# Patient Record
Sex: Female | Born: 1955 | Race: Asian | Hispanic: No | Smoking: Never smoker
Health system: Southern US, Community
[De-identification: ages and names within clinical notes are randomized; demographics above are authoritative.]

## PROBLEM LIST (undated history)

## (undated) DIAGNOSIS — E119 Type 2 diabetes mellitus without complications: Secondary | ICD-10-CM

## (undated) HISTORY — PX: ABDOMINAL HYSTERECTOMY: SHX81

## (undated) NOTE — *Deleted (*Deleted)
Took rep

---

## 2015-08-02 ENCOUNTER — Emergency Department
Admission: EM | Admit: 2015-08-02 | Discharge: 2015-08-02 | Disposition: A | Discharge status: Home / Self Care | Payer: BLUE CROSS/BLUE SHIELD | Attending: Emergency Medicine | Admitting: Emergency Medicine

## 2015-08-02 ENCOUNTER — Encounter: Payer: Self-pay | Admitting: Emergency Medicine

## 2015-08-02 DIAGNOSIS — E1165 Type 2 diabetes mellitus with hyperglycemia: Secondary | ICD-10-CM | POA: Diagnosis not present

## 2015-08-02 DIAGNOSIS — R300 Dysuria: Secondary | ICD-10-CM | POA: Diagnosis present

## 2015-08-02 DIAGNOSIS — R739 Hyperglycemia, unspecified: Secondary | ICD-10-CM

## 2015-08-02 DIAGNOSIS — N390 Urinary tract infection, site not specified: Secondary | ICD-10-CM | POA: Diagnosis not present

## 2015-08-02 HISTORY — DX: Type 2 diabetes mellitus without complications: E11.9

## 2015-08-02 LAB — URINALYSIS COMPLETE WITH MICROSCOPIC (ARMC ONLY)
BACTERIA UA: NONE SEEN
Bilirubin Urine: NEGATIVE
Glucose, UA: >500 mg/dL — AB
Ketones, ur: NEGATIVE mg/dL
Nitrite: NEGATIVE
PROTEIN: 100 mg/dL — AB
SQUAMOUS EPITHELIAL / LPF: NONE SEEN
Specific Gravity, Urine: 1.017 (ref 1.005–1.030)
pH: 6 (ref 5.0–8.0)

## 2015-08-02 LAB — GLUCOSE, CAPILLARY: GLUCOSE-CAPILLARY: 323 mg/dL — AB (ref 65–99)

## 2015-08-02 MED ORDER — SULFAMETHOXAZOLE-TRIMETHOPRIM 800-160 MG PO TABS: 1.0000 | ORAL_TABLET | Freq: Two times a day (BID) | ORAL | Status: DC

## 2015-08-02 MED ORDER — PHENAZOPYRIDINE HCL 200 MG PO TABS: 200.0000 mg | ORAL_TABLET | Freq: Three times a day (TID) | ORAL | Status: DC | PRN

## 2015-08-02 NOTE — ED Notes (Signed)
Pt and family taught the importance of follow up for hyperglycemia. Pts family verbalized understanding.

## 2015-08-02 NOTE — ED Provider Notes (Signed)
[facility] Emergency Department Provider Note  ____________________________________________  Time seen: Approximately 4:44 PM  I have reviewed the triage vital signs and the nursing notes.   HISTORY  Chief Complaint Dysuria    HPI Alexis Daugherty is a 60 y.o. female presents for evaluation of painful urination times one week. Patient reports having blood in her urine with frequency. No relief with over-the-counter medications. Positive for low-grade fever. Past medical history significant for diabetes without complications.   Past Medical History  Diagnosis Date  . Diabetes mellitus without complication (HCC)     There are no active problems to display for this patient.   Past Surgical History  Procedure Laterality Date  . Abdominal hysterectomy      Current Outpatient Rx  Name  Route  Sig  Dispense  Refill  . phenazopyridine (PYRIDIUM) 200 MG tablet   Oral   Take 1 tablet (200 mg total) by mouth 3 (three) times daily as needed for pain.   6 tablet   0   . sulfamethoxazole-trimethoprim (BACTRIM DS,SEPTRA DS) 800-160 MG tablet   Oral   Take 1 tablet by mouth 2 (two) times daily.   20 tablet   0     Allergies Review of patient's allergies indicates no known allergies.  No family history on file.  Social History Social History  Substance Use Topics  . Smoking status: Never Smoker   . Smokeless tobacco: None  . Alcohol Use: No    Review of Systems Constitutional: No fever/chills Eyes: No visual changes. ENT: No sore throat. Cardiovascular: Denies chest pain. Respiratory: Denies shortness of breath. Gastrointestinal: No abdominal pain.  No nausea, no vomiting.  No diarrhea.  No constipation. Genitourinary: Positive for dysuria and frequency. Musculoskeletal: Negative for back pain. Skin: Negative for rash. Neurological: Negative for headaches, focal weakness or numbness.  10-point ROS otherwise  negative.  ____________________________________________   PHYSICAL EXAM:  VITAL SIGNS: ED Triage Vitals  Enc Vitals Group     BP 08/02/15 1628 137/73 mmHg     Pulse Rate 08/02/15 1628 93     Resp 08/02/15 1628 18     Temp 08/02/15 1628 99.8 F (37.7 C)     Temp Source 08/02/15 1628 Oral     SpO2 08/02/15 1628 98 %     Weight 08/02/15 1628 130 lb (58.968 kg)     Height 08/02/15 1628 5\' 1"  (1.549 m)     Head Cir --      Peak Flow --      Pain Score 08/02/15 1629 9     Pain Loc --      Pain Edu? --      Excl. in GC? --     Constitutional: Alert and oriented. Well appearing and in no acute distress. Cardiovascular: Normal rate, regular rhythm. Grossly normal heart sounds.  Good peripheral circulation. Respiratory: Normal respiratory effort.  No retractions. Lungs CTAB. Gastrointestinal: Soft and nontender. No distention. No abdominal bruits. Mild CVAT Musculoskeletal: No lower extremity tenderness nor edema.  No joint effusions. Neurologic:  Normal speech and language. No gross focal neurologic deficits are appreciated. No gait instability. Skin:  Skin is warm, dry and intact. No rash noted. Psychiatric: Mood and affect are normal. Speech and behavior are normal.  ____________________________________________   LABS (all labs ordered are listed, but only abnormal results are displayed)  Labs Reviewed  URINALYSIS COMPLETEWITH MICROSCOPIC ([facility] ONLY) - Abnormal; Notable for the following:    Color, Urine STRAW (*)  APPearance CLOUDY (*)    Glucose, UA >500 (*)    Hgb urine dipstick 2+ (*)    Protein, ur 100 (*)    Leukocytes, UA 3+ (*)    All other components within normal limits  GLUCOSE, CAPILLARY - Abnormal; Notable for the following:    Glucose-Capillary 323 (*)    All other components within normal limits  CBG MONITORING, ED    ____________________________________________  EKG   ____________________________________________  RADIOLOGY   ____________________________________________   PROCEDURES  Procedure(s) performed: None  Critical Care performed: No  ____________________________________________   INITIAL IMPRESSION / ASSESSMENT AND PLAN / ED COURSE  Pertinent labs & imaging results that were available during my care of the patient were reviewed by me and considered in my medical decision making (see chart for details).  Acute urinary tract infection with secondary hyperglycemia. Rx given for Bactrim DS twice a day, peridium 3 times a day. Patient encouraged to follow up with PCP for elevated blood glucose which was 323 while in the ED. Patient voices no other emergency medical complaints at this time. ____________________________________________   FINAL CLINICAL IMPRESSION(S) / ED DIAGNOSES  Final diagnoses:  UTI (lower urinary tract infection)  Hyperglycemia     This chart was dictated using voice recognition software/Dragon. Despite best efforts to proofread, errors can occur which can change the meaning. Any change was purely unintentional.   Evangeline Dakin, PA-C 08/02/15 1753  Rockne Menghini, MD 08/02/15 1850

## 2015-08-02 NOTE — ED Notes (Signed)
Pain with urination x 1 week

## 2015-08-02 NOTE — Discharge Instructions (Signed)
Urinary Tract Infection Urinary tract infections (UTIs) can develop anywhere along your urinary tract. Your urinary tract is your body's drainage system for removing wastes and extra water. Your urinary tract includes two kidneys, two ureters, a bladder, and a urethra. Your kidneys are a pair of bean-shaped organs. Each kidney is about the size of your fist. They are located below your ribs, one on each side of your spine. CAUSES Infections are caused by microbes, which are microscopic organisms, including fungi, viruses, and bacteria. These organisms are so small that they can only be seen through a microscope. Bacteria are the microbes that most commonly cause UTIs. SYMPTOMS  Symptoms of UTIs may vary by age and gender of the patient and by the location of the infection. Symptoms in young women typically include a frequent and intense urge to urinate and a painful, burning feeling in the bladder or urethra during urination. Older women and men are more likely to be tired, shaky, and weak and have muscle aches and abdominal pain. A fever may mean the infection is in your kidneys. Other symptoms of a kidney infection include pain in your back or sides below the ribs, nausea, and vomiting. DIAGNOSIS To diagnose a UTI, your caregiver will ask you about your symptoms. Your caregiver will also ask you to provide a urine sample. The urine sample will be tested for bacteria and white blood cells. White blood cells are made by your body to help fight infection. TREATMENT  Typically, UTIs can be treated with medication. Because most UTIs are caused by a bacterial infection, they usually can be treated with the use of antibiotics. The choice of antibiotic and length of treatment depend on your symptoms and the type of bacteria causing your infection. HOME CARE INSTRUCTIONS  If you were prescribed antibiotics, take them exactly as your caregiver instructs you. Finish the medication even if you feel better after  you have only taken some of the medication.  Drink enough water and fluids to keep your urine clear or pale yellow.  Avoid caffeine, tea, and carbonated beverages. They tend to irritate your bladder.  Empty your bladder often. Avoid holding urine for long periods of time.  Empty your bladder before and after sexual intercourse.  After a bowel movement, women should cleanse from front to back. Use each tissue only once. SEEK MEDICAL CARE IF:   You have back pain.  You develop a fever.  Your symptoms do not begin to resolve within 3 days. SEEK IMMEDIATE MEDICAL CARE IF:   You have severe back pain or lower abdominal pain.  You develop chills.  You have nausea or vomiting.  You have continued burning or discomfort with urination. MAKE SURE YOU:   Understand these instructions.  Will watch your condition.  Will get help right away if you are not doing well or get worse.   This information is not intended to replace advice given to you by your health care provider. Make sure you discuss any questions you have with your health care provider.   Document Released: 11/23/2004 Document Revised: 11/04/2014 Document Reviewed: 03/24/2011 Elsevier Interactive Patient Education 2016 [employer].  Screening for Type 2 Diabetes Screening is a way to check for type 2 diabetes in people who do not have symptoms of the disease, but who may likely develop diabetes in the future. Diabetes can lead to serious health problems, but finding diabetes early allows for early treatment. DIABETES RISK FACTORS   Family history of diabetes.  Diseases  of the pancreas.  Obesity or being overweight.  Certain racial or ethnic groups:  American [place].  Pacific Islander.  Hispanic.  Asian.  African American.  High blood pressure (hypertension).  History of diabetes while pregnant (gestational diabetes).  Delivering a baby that weighed over 9 pounds.  Being inactive.  High  cholesterol or triglycerides.  Age, especially over 62 years of age.  Other diseases or conditions.  Diseases of the pancreas.  Cardiovascular disease.  Disorders of the endocrine system.  Certain medicines, such as those that treat high blood cholesterol levels. WHO IS SCREENED Adults  Adults who have no risk factors and no symptoms should be screened starting at age 62. If the screening tests are normal, they should be repeated every 3 years.  Adults who do not have symptoms, but have 1 or more risk factors, should be screened.  Adults who have 2 or more risk factors may be screened every year.  Adults who have an A1c (3 month average of blood glucose) greater than 5.7% or who had an impaired glucose tolerance (IGT) or impaired fasting glucose (IFG) on a previous test should be screened.  Pregnant women who have risk factors should be screened at their first prenatal visit.  Women who have given birth and had gestational diabetes should be screened 6-12 weeks after the child is born. This screening should be repeated every 1-3 years after the first test. Children or Adolescents  Children and adolescents should be screened for type 2 diabetes if they are overweight and have 2 of the following risk factors:  Having a family history of type 2 diabetes.  Being a member of a high risk race or ethnic group.  Having signs of insulin resistance or conditions associated with insulin resistance.  Having a mother who had gestational diabetes while pregnant with him or her.  Screening should start at age 62 or at the onset of puberty, whichever comes first. This should be repeated every 2 years. SCREENING In a screening, your caregiver may:  Ask questions about your overall health. This will include questions about the health of close family members, too.  Ask about any diabetes-like symptoms you may have.  Perform a physical exam.  Order some tests that may include:  A  fasting plasma glucose test. This measures the level of glucose in your blood. It is done after you have had nothing to eat but water (fasted) for 8 hours.  A random blood glucose test. This test is done without the need to fast.  An oral glucose tolerance test. This is a blood test done in 2 parts. First, a blood sample is taken after you have fasted. Then, another sample is taken after you drink a liquid that contains a lot of sugar.  An A1c test. This test shows how much glucose has been in your blood over the past 2 to 3 months.   This information is not intended to replace advice given to you by your health care provider. Make sure you discuss any questions you have with your health care provider.   Document Released: 12/10/2008 Document Revised: 03/06/2014 Document Reviewed: 09/21/2010 Elsevier Interactive Patient Education [employer] Elsevier Inc.

## 2016-11-17 ENCOUNTER — Ambulatory Visit
Admission: RE | Admit: 2016-11-17 | Discharge: 2016-11-17 | Disposition: A | Discharge status: Home / Self Care | Payer: Self-pay | Source: Ambulatory Visit | Attending: Family Medicine | Admitting: Family Medicine

## 2016-11-17 ENCOUNTER — Other Ambulatory Visit (HOSPITAL_COMMUNITY): Payer: Self-pay | Admitting: Family Medicine

## 2016-11-17 DIAGNOSIS — R7611 Nonspecific reaction to tuberculin skin test without active tuberculosis: Secondary | ICD-10-CM

## 2019-12-15 ENCOUNTER — Other Ambulatory Visit: Payer: Self-pay

## 2019-12-15 ENCOUNTER — Emergency Department: Payer: Medicare Other

## 2019-12-15 ENCOUNTER — Encounter: Payer: Self-pay | Admitting: Emergency Medicine

## 2019-12-15 ENCOUNTER — Inpatient Hospital Stay
Admission: EM | Admit: 2019-12-15 | Discharge: 2019-12-29 | DRG: 871 | Disposition: E | Payer: Medicare Other | Attending: Internal Medicine | Admitting: Internal Medicine

## 2019-12-15 DIAGNOSIS — E872 Acidosis, unspecified: Secondary | ICD-10-CM

## 2019-12-15 DIAGNOSIS — J189 Pneumonia, unspecified organism: Secondary | ICD-10-CM | POA: Diagnosis not present

## 2019-12-15 DIAGNOSIS — R7989 Other specified abnormal findings of blood chemistry: Secondary | ICD-10-CM

## 2019-12-15 DIAGNOSIS — R001 Bradycardia, unspecified: Secondary | ICD-10-CM | POA: Diagnosis not present

## 2019-12-15 DIAGNOSIS — J1282 Pneumonia due to coronavirus disease 2019: Secondary | ICD-10-CM | POA: Diagnosis present

## 2019-12-15 DIAGNOSIS — Z01818 Encounter for other preprocedural examination: Secondary | ICD-10-CM

## 2019-12-15 DIAGNOSIS — Z20822 Contact with and (suspected) exposure to covid-19: Secondary | ICD-10-CM | POA: Diagnosis present

## 2019-12-15 DIAGNOSIS — I468 Cardiac arrest due to other underlying condition: Secondary | ICD-10-CM | POA: Diagnosis not present

## 2019-12-15 DIAGNOSIS — A419 Sepsis, unspecified organism: Secondary | ICD-10-CM | POA: Diagnosis present

## 2019-12-15 DIAGNOSIS — J9601 Acute respiratory failure with hypoxia: Secondary | ICD-10-CM

## 2019-12-15 DIAGNOSIS — U071 COVID-19: Secondary | ICD-10-CM | POA: Diagnosis present

## 2019-12-15 DIAGNOSIS — E1165 Type 2 diabetes mellitus with hyperglycemia: Secondary | ICD-10-CM | POA: Diagnosis present

## 2019-12-15 DIAGNOSIS — R6521 Severe sepsis with septic shock: Secondary | ICD-10-CM | POA: Diagnosis present

## 2019-12-15 DIAGNOSIS — Z8615 Personal history of latent tuberculosis infection: Secondary | ICD-10-CM | POA: Diagnosis not present

## 2019-12-15 DIAGNOSIS — E119 Type 2 diabetes mellitus without complications: Secondary | ICD-10-CM | POA: Diagnosis not present

## 2019-12-15 DIAGNOSIS — R0902 Hypoxemia: Secondary | ICD-10-CM

## 2019-12-15 DIAGNOSIS — Z794 Long term (current) use of insulin: Secondary | ICD-10-CM

## 2019-12-15 DIAGNOSIS — E876 Hypokalemia: Secondary | ICD-10-CM | POA: Diagnosis present

## 2019-12-15 DIAGNOSIS — B181 Chronic viral hepatitis B without delta-agent: Secondary | ICD-10-CM | POA: Diagnosis present

## 2019-12-15 DIAGNOSIS — R739 Hyperglycemia, unspecified: Secondary | ICD-10-CM

## 2019-12-15 DIAGNOSIS — A4189 Other specified sepsis: Secondary | ICD-10-CM | POA: Diagnosis present

## 2019-12-15 DIAGNOSIS — Z90710 Acquired absence of both cervix and uterus: Secondary | ICD-10-CM

## 2019-12-15 DIAGNOSIS — Z7189 Other specified counseling: Secondary | ICD-10-CM

## 2019-12-15 DIAGNOSIS — G928 Other toxic encephalopathy: Secondary | ICD-10-CM | POA: Diagnosis present

## 2019-12-15 DIAGNOSIS — Z515 Encounter for palliative care: Secondary | ICD-10-CM

## 2019-12-15 DIAGNOSIS — J9600 Acute respiratory failure, unspecified whether with hypoxia or hypercapnia: Secondary | ICD-10-CM | POA: Diagnosis not present

## 2019-12-15 DIAGNOSIS — I472 Ventricular tachycardia: Secondary | ICD-10-CM | POA: Diagnosis not present

## 2019-12-15 DIAGNOSIS — R0602 Shortness of breath: Secondary | ICD-10-CM

## 2019-12-15 DIAGNOSIS — Z4659 Encounter for fitting and adjustment of other gastrointestinal appliance and device: Secondary | ICD-10-CM

## 2019-12-15 DIAGNOSIS — E0800 Diabetes mellitus due to underlying condition with hyperosmolarity without nonketotic hyperglycemic-hyperosmolar coma (NKHHC): Secondary | ICD-10-CM

## 2019-12-15 DIAGNOSIS — J80 Acute respiratory distress syndrome: Secondary | ICD-10-CM | POA: Diagnosis present

## 2019-12-15 DIAGNOSIS — R778 Other specified abnormalities of plasma proteins: Secondary | ICD-10-CM

## 2019-12-15 LAB — RESPIRATORY PANEL BY RT PCR (FLU A&B, COVID)
Influenza A by PCR: NEGATIVE
Influenza B by PCR: NEGATIVE
SARS Coronavirus 2 by RT PCR: POSITIVE — AB

## 2019-12-15 LAB — GLUCOSE, CAPILLARY
Glucose-Capillary: 175 mg/dL — ABNORMAL HIGH (ref 70–99)
Glucose-Capillary: 75 mg/dL (ref 70–99)
Glucose-Capillary: 140 mg/dL — ABNORMAL HIGH (ref 70–99)
Glucose-Capillary: 160 mg/dL — ABNORMAL HIGH (ref 70–99)

## 2019-12-15 LAB — URINALYSIS, COMPLETE (UACMP) WITH MICROSCOPIC
Bacteria, UA: NONE SEEN
Bilirubin Urine: NEGATIVE
Glucose, UA: >=500 mg/dL — AB
Ketones, ur: 20 mg/dL — AB
Leukocytes,Ua: NEGATIVE
Nitrite: NEGATIVE
Protein, ur: 100 mg/dL — AB
Specific Gravity, Urine: 1.016 (ref 1.005–1.030)
pH: 6 (ref 5.0–8.0)

## 2019-12-15 LAB — COMPREHENSIVE METABOLIC PANEL
ALT: 41 U/L (ref 0–44)
AST: 46 U/L — ABNORMAL HIGH (ref 15–41)
Albumin: 3.1 g/dL — ABNORMAL LOW (ref 3.5–5.0)
Alkaline Phosphatase: 90 U/L (ref 38–126)
Anion gap: 14 (ref 5–15)
BUN: 15 mg/dL (ref 8–23)
CO2: 23 mmol/L (ref 22–32)
Calcium: 8.6 mg/dL — ABNORMAL LOW (ref 8.9–10.3)
Chloride: 94 mmol/L — ABNORMAL LOW (ref 98–111)
Creatinine, Ser: 0.85 mg/dL (ref 0.44–1.00)
GFR, Estimated: >60 mL/min (ref >60)
Glucose, Bld: 230 mg/dL — ABNORMAL HIGH (ref 70–99)
Potassium: 3.4 mmol/L — ABNORMAL LOW (ref 3.5–5.1)
Sodium: 131 mmol/L — ABNORMAL LOW (ref 135–145)
Total Bilirubin: 1.6 mg/dL — ABNORMAL HIGH (ref 0.3–1.2)
Total Protein: 7.8 g/dL (ref 6.5–8.1)

## 2019-12-15 LAB — CBC WITH DIFFERENTIAL/PLATELET
Abs Immature Granulocytes: 0.21 10*3/uL — ABNORMAL HIGH (ref 0.00–0.07)
Basophils Absolute: 0 10*3/uL (ref 0.0–0.1)
Basophils Relative: 0 %
Eosinophils Absolute: 0 10*3/uL (ref 0.0–0.5)
Eosinophils Relative: 0 %
HCT: 40.1 % (ref 36.0–46.0)
Hemoglobin: 13.4 g/dL (ref 12.0–15.0)
Immature Granulocytes: 2 %
Lymphocytes Relative: 12 %
Lymphs Abs: 1.5 10*3/uL (ref 0.7–4.0)
MCH: 28.9 pg (ref 26.0–34.0)
MCHC: 33.4 g/dL (ref 30.0–36.0)
MCV: 86.4 fL (ref 80.0–100.0)
Monocytes Absolute: 0.7 10*3/uL (ref 0.1–1.0)
Monocytes Relative: 5 %
Neutro Abs: 10.4 10*3/uL — ABNORMAL HIGH (ref 1.7–7.7)
Neutrophils Relative %: 81 %
Platelets: 417 10*3/uL — ABNORMAL HIGH (ref 150–400)
RBC: 4.64 MIL/uL (ref 3.87–5.11)
RDW: 12.2 % (ref 11.5–15.5)
WBC: 12.8 10*3/uL — ABNORMAL HIGH (ref 4.0–10.5)
nRBC: 0 % (ref 0.0–0.2)

## 2019-12-15 LAB — LACTIC ACID, PLASMA
Lactic Acid, Venous: 3.4 mmol/L (ref 0.5–1.9)
Lactic Acid, Venous: 1.7 mmol/L (ref 0.5–1.9)

## 2019-12-15 LAB — BRAIN NATRIURETIC PEPTIDE: B Natriuretic Peptide: 78.4 pg/mL (ref 0.0–100.0)

## 2019-12-15 LAB — BLOOD GAS, VENOUS
Acid-Base Excess: 0.8 mmol/L (ref 0.0–2.0)
Bicarbonate: 24.2 mmol/L (ref 20.0–28.0)
O2 Saturation: 81.6 %
Patient temperature: 37
pCO2, Ven: 34 mmHg — ABNORMAL LOW (ref 44.0–60.0)
pH, Ven: 7.46 — ABNORMAL HIGH (ref 7.250–7.430)
pO2, Ven: 43 mmHg (ref 32.0–45.0)

## 2019-12-15 LAB — APTT: aPTT: 31 seconds (ref 24–36)

## 2019-12-15 LAB — TSH: TSH: 0.165 u[IU]/mL — ABNORMAL LOW (ref 0.350–4.500)

## 2019-12-15 LAB — RESP PANEL BY RT PCR (RSV, FLU A&B, COVID)
Influenza A by PCR: NEGATIVE
Influenza B by PCR: NEGATIVE
Respiratory Syncytial Virus by PCR: NEGATIVE
SARS Coronavirus 2 by RT PCR: NEGATIVE

## 2019-12-15 LAB — PROCALCITONIN: Procalcitonin: 0.14 ng/mL

## 2019-12-15 LAB — T4, FREE: Free T4: 1.39 ng/dL — ABNORMAL HIGH (ref 0.61–1.12)

## 2019-12-15 LAB — PROTIME-INR
INR: 1.2 (ref 0.8–1.2)
Prothrombin Time: 14.6 seconds (ref 11.4–15.2)

## 2019-12-15 LAB — TROPONIN I (HIGH SENSITIVITY)
Troponin I (High Sensitivity): 20 ng/L — ABNORMAL HIGH (ref <18)
Troponin I (High Sensitivity): 28 ng/L — ABNORMAL HIGH (ref <18)

## 2019-12-15 LAB — HEMOGLOBIN A1C
Hgb A1c MFr Bld: 10.8 % — ABNORMAL HIGH (ref 4.8–5.6)
Mean Plasma Glucose: 263 mg/dL

## 2019-12-15 LAB — FIBRIN DERIVATIVES D-DIMER (ARMC ONLY): Fibrin derivatives D-dimer (ARMC): >7500 ng/mL (FEU) — ABNORMAL HIGH (ref 0.00–499.00)

## 2019-12-15 LAB — MAGNESIUM: Magnesium: 2.1 mg/dL (ref 1.7–2.4)

## 2019-12-15 MED ORDER — SODIUM CHLORIDE 0.9 % IV SOLN
200.0000 mg | Freq: Once | INTRAVENOUS | Status: AC
  Administered 2019-12-15: 200 mg via INTRAVENOUS
  Filled 2019-12-15: qty 40

## 2019-12-15 MED ORDER — POTASSIUM & SODIUM PHOSPHATES 280-160-250 MG PO PACK
1.0000 | PACK | Freq: Once | ORAL | Status: AC
  Administered 2019-12-15: 1 via ORAL
  Filled 2019-12-15: qty 1

## 2019-12-15 MED ORDER — SODIUM CHLORIDE 0.9 % IV SOLN
100.0000 mg | Freq: Every day | INTRAVENOUS | Status: DC
  Administered 2019-12-17 – 2019-12-19 (×3): 100 mg via INTRAVENOUS
  Filled 2019-12-15: qty 100
  Filled 2019-12-15: qty 20
  Filled 2019-12-15 (×2): qty 100

## 2019-12-15 MED ORDER — ONDANSETRON HCL 4 MG/2ML IJ SOLN: 4.0000 mg | Freq: Four times a day (QID) | INTRAMUSCULAR | Status: DC | PRN

## 2019-12-15 MED ORDER — ENOXAPARIN SODIUM 40 MG/0.4ML ~~LOC~~ SOLN
40.0000 mg | SUBCUTANEOUS | Status: DC
  Administered 2019-12-15 – 2019-12-19 (×5): 40 mg via SUBCUTANEOUS
  Filled 2019-12-15 (×5): qty 0.4

## 2019-12-15 MED ORDER — INSULIN ASPART 100 UNIT/ML ~~LOC~~ SOLN
0.0000 [IU] | Freq: Three times a day (TID) | SUBCUTANEOUS | Status: DC
  Administered 2019-12-15: 3 [IU] via SUBCUTANEOUS
  Administered 2019-12-16: 8 [IU] via SUBCUTANEOUS
  Administered 2019-12-16: 5 [IU] via SUBCUTANEOUS
  Filled 2019-12-15 (×3): qty 1

## 2019-12-15 MED ORDER — ACETAMINOPHEN 325 MG PO TABS
650.0000 mg | ORAL_TABLET | Freq: Four times a day (QID) | ORAL | Status: DC | PRN
  Administered 2019-12-15 – 2019-12-19 (×5): 650 mg via ORAL
  Filled 2019-12-15 (×5): qty 2

## 2019-12-15 MED ORDER — INSULIN ASPART 100 UNIT/ML ~~LOC~~ SOLN
0.0000 [IU] | SUBCUTANEOUS | Status: DC
  Administered 2019-12-15: 5 [IU] via SUBCUTANEOUS
  Filled 2019-12-15: qty 1

## 2019-12-15 MED ORDER — ACETAMINOPHEN 500 MG PO TABS
1000.0000 mg | ORAL_TABLET | Freq: Once | ORAL | Status: AC
  Administered 2019-12-15: 1000 mg via ORAL
  Filled 2019-12-15: qty 2

## 2019-12-15 MED ORDER — METHYLPREDNISOLONE SODIUM SUCC 125 MG IJ SOLR
60.0000 mg | Freq: Three times a day (TID) | INTRAMUSCULAR | Status: AC
  Administered 2019-12-15 – 2019-12-17 (×5): 60 mg via INTRAVENOUS
  Filled 2019-12-15 (×5): qty 2

## 2019-12-15 MED ORDER — IOHEXOL 350 MG/ML SOLN
75.0000 mL | Freq: Once | INTRAVENOUS | Status: AC | PRN
  Administered 2019-12-15: 75 mL via INTRAVENOUS

## 2019-12-15 MED ORDER — SODIUM CHLORIDE 0.9 % IV SOLN: INTRAVENOUS | Status: DC

## 2019-12-15 MED ORDER — ONDANSETRON HCL 4 MG PO TABS: 4.0000 mg | ORAL_TABLET | Freq: Four times a day (QID) | ORAL | Status: DC | PRN

## 2019-12-15 MED ORDER — SODIUM CHLORIDE 0.9 % IV SOLN: 2.0000 g | INTRAVENOUS | Status: DC

## 2019-12-15 MED ORDER — SODIUM CHLORIDE 0.9 % IV SOLN
2.0000 g | INTRAVENOUS | Status: AC
  Administered 2019-12-15 – 2019-12-19 (×5): 2 g via INTRAVENOUS
  Filled 2019-12-15 (×3): qty 20
  Filled 2019-12-15 (×3): qty 2

## 2019-12-15 MED ORDER — SODIUM CHLORIDE 0.9 % IV SOLN: 500.0000 mg | INTRAVENOUS | Status: DC

## 2019-12-15 MED ORDER — LACTATED RINGERS IV BOLUS
1000.0000 mL | Freq: Once | INTRAVENOUS | Status: AC
  Administered 2019-12-15: 1000 mL via INTRAVENOUS

## 2019-12-15 MED ORDER — SODIUM CHLORIDE 0.9 % IV SOLN
500.0000 mg | INTRAVENOUS | Status: AC
  Administered 2019-12-15 – 2019-12-19 (×5): 500 mg via INTRAVENOUS
  Filled 2019-12-15 (×6): qty 500

## 2019-12-15 MED ORDER — POTASSIUM CHLORIDE CRYS ER 20 MEQ PO TBCR
40.0000 meq | EXTENDED_RELEASE_TABLET | Freq: Once | ORAL | Status: AC
  Administered 2019-12-15: 40 meq via ORAL
  Filled 2019-12-15: qty 2

## 2019-12-15 MED ORDER — POTASSIUM CHLORIDE CRYS ER 20 MEQ PO TBCR: 80.0000 meq | EXTENDED_RELEASE_TABLET | Freq: Once | ORAL | Status: DC

## 2019-12-15 MED ORDER — ACETAMINOPHEN 650 MG RE SUPP
650.0000 mg | Freq: Four times a day (QID) | RECTAL | Status: DC | PRN
  Administered 2019-12-19: 650 mg via RECTAL
  Filled 2019-12-15: qty 1

## 2019-12-15 MED ORDER — LACTATED RINGERS IV SOLN: INTRAVENOUS | Status: DC

## 2019-12-15 NOTE — Progress Notes (Signed)
CODE SEPSIS - PHARMACY COMMUNICATION  **Broad Spectrum Antibiotics should be administered within 1 hour of Sepsis diagnosis**  Time Code Sepsis Called/Page Received: 0813  Antibiotics Ordered: ceftriaxone/azithromycin  Time of 1st antibiotic administration: 0820  Additional action taken by pharmacy: NA  If necessary, Name of Provider/Nurse Contacted: NA    Pricilla Riffle ,PharmD Clinical Pharmacist  12/08/2019  9:08 AM

## 2019-12-15 NOTE — ED Triage Notes (Signed)
Pt presents via [facility] with c/o weakness and AMS since last friday. 100.9 temp for ems. Pt was 75% on room air for ems. On non-rebreather, pt was 90%. Pt is alert, but appears lethargic. Blood sugar 254 for ems.

## 2019-12-15 NOTE — Consult Note (Signed)
NAME: Alexis Daugherty  DOB: 03/14/1955  MRN: [id number]  Date/Time: 2019/04/18 2:58 PM  REQUESTING PROVIDER: Dr. Joylene Igoagbata Subjective:  REASON FOR CONSULT: Acute hypoxic respiratory failure with bilateral infiltrates.   Patient has respiratory distress and is unable to give any history.  Daughter at bedside.  Spoke to her. ? Alexis Daugherty is a 64 y.o. female from [place] with a history of diabetes mellitus, chronic hepatitis B infection, latent TB treatment.  Presents with increasing shortness of breath for the past few days. As per the daughter 2 weeks ago patient's had fever cold body aches.  She did not go to her PCP.  That got better.  But since Friday patient has had increasing shortness of breath and cough.  And she came to the hospital today. As per daughter she immigrated from [place] in 2009.  Has not returned since then. As per her daughter her dad passed away of TB.  Mom was treated for latent tuberculosis when she came to this country with 6 months of INH.  She will get the records that was given to her by the Department of Health. Intolerant patient worked in the hospital.  She never worked in the past he feels are [place] the farm.  She does not have any pets. Patient lives with her 2 daughters, 2 grandkids, son-in-law.  No one else was sick before the patient.  But one of the kids is sick now.  Patient has not been vaccinated.  All are eligible family believes are vaccinated. She does not take any recent antibiotics. She not take any steroids. No travel history Non-smoker. [facility].  Past Medical History:  Diagnosis Date  . Diabetes mellitus without complication (HCC)   Latent TB treatment. Chronic hepatitis B infection.  [facility]  Last viral load was a 2000 copies.  Not been treated yet.  Past Surgical History:  Procedure Laterality Date  . ABDOMINAL HYSTERECTOMY      Social History   Socioeconomic History  . Marital status: Widowed    Spouse name: Not on file  .  Number of children: Not on file  . Years of education: Not on file  . Highest education level: Not on file  Occupational History  . Not on file  Tobacco Use  . Smoking status: Never Smoker  Substance and Sexual Activity  . Alcohol use: No  . Drug use: Not on file  . Sexual activity: Not on file  Other Topics Concern  . Not on file  Social History Narrative  . Not on file   Social Determinants of Health   Financial Resource Strain:   . Difficulty of Paying Living Expenses: Not on file  Food Insecurity:   . Worried About Programme researcher, broadcasting/film/videounning Out of Food in the Last Year: Not on file  . Ran Out of Food in the Last Year: Not on file  Transportation Needs:   . Lack of Transportation (Medical): Not on file  . Lack of Transportation (Non-Medical): Not on file  Physical Activity:   . Days of Exercise per Week: Not on file  . Minutes of Exercise per Session: Not on file  Stress:   . Feeling of Stress : Not on file  Social Connections:   . Frequency of Communication with Friends and Family: Not on file  . Frequency of Social Gatherings with Friends and Family: Not on file  . Attends Religious Services: Not on file  . Active Member of Clubs or Organizations: Not on file  .  Attends Banker Meetings: Not on file  . Marital Status: Not on file  Intimate Partner Violence:   . Fear of Current or Ex-Partner: Not on file  . Emotionally Abused: Not on file  . Physically Abused: Not on file  . Sexually Abused: Not on file    History reviewed. No pertinent family history. No Known Allergies  ? Current Facility-Administered Medications  Medication Dose Route Frequency Provider Last Rate Last Admin  . 0.9 %  sodium chloride infusion   Intravenous Continuous Agbata, Tochukwu, MD 125 mL/hr at 12/25/2019 1203 New Bag at 12/23/2019 1203  . acetaminophen (TYLENOL) tablet 650 mg  650 mg Oral Q6H PRN Agbata, Tochukwu, MD       Or  . acetaminophen (TYLENOL) suppository 650 mg  650 mg Rectal Q6H PRN  Agbata, Tochukwu, MD      . azithromycin (ZITHROMAX) 500 mg in sodium chloride 0.9 % 250 mL IVPB  500 mg Intravenous Q24H Gilles Chiquito, MD   Stopped at 11/29/2019 1015  . cefTRIAXone (ROCEPHIN) 2 g in sodium chloride 0.9 % 100 mL IVPB  2 g Intravenous Q24H Gilles Chiquito, MD   Stopped at 12/01/2019 1015  . enoxaparin (LOVENOX) injection 40 mg  40 mg Subcutaneous Q24H Agbata, Tochukwu, MD      . insulin aspart (novoLOG) injection 0-15 Units  0-15 Units Subcutaneous TID WC Agbata, Tochukwu, MD   3 Units at 12/08/2019 1209  . ondansetron (ZOFRAN) tablet 4 mg  4 mg Oral Q6H PRN Agbata, Tochukwu, MD       Or  . ondansetron (ZOFRAN) injection 4 mg  4 mg Intravenous Q6H PRN Agbata, Tochukwu, MD      . potassium chloride SA (KLOR-CON) CR tablet 40 mEq  40 mEq Oral Once Agbata, Tochukwu, MD       Current Outpatient Medications  Medication Sig Dispense Refill  . acetaminophen (TYLENOL) 325 MG tablet Take 325-650 mg by mouth every 6 (six) hours as needed for mild pain or fever.    Marland Kitchen ibuprofen (ADVIL) 200 MG tablet Take 400 mg by mouth every 6 (six) hours as needed for fever or mild pain.    Marland Kitchen insulin aspart (NOVOLOG) 100 UNIT/ML FlexPen Inject 16 Units into the skin daily before breakfast.    . LANTUS SOLOSTAR 100 UNIT/ML Solostar Pen Inject 40 Units into the skin at bedtime. (increase by 2u every three days if needed to maintain target glucose level)       Abtx:  Anti-infectives (From admission, onward)   Start     Dose/Rate Route Frequency Ordered Stop   11/29/2019 1145  cefTRIAXone (ROCEPHIN) 2 g in sodium chloride 0.9 % 100 mL IVPB  Status:  Discontinued        2 g 200 mL/hr over 30 Minutes Intravenous Every 24 hours 12/02/2019 1133 12/07/2019 1134   11/30/2019 1145  azithromycin (ZITHROMAX) 500 mg in sodium chloride 0.9 % 250 mL IVPB  Status:  Discontinued        500 mg 250 mL/hr over 60 Minutes Intravenous Every 24 hours 12/10/2019 1133 12/18/2019 1134   12/12/2019 0815  cefTRIAXone (ROCEPHIN) 2 g in sodium  chloride 0.9 % 100 mL IVPB        2 g 200 mL/hr over 30 Minutes Intravenous Every 24 hours 12/05/2019 0807     12/14/2019 0815  azithromycin (ZITHROMAX) 500 mg in sodium chloride 0.9 % 250 mL IVPB        500 mg 250 mL/hr over  60 Minutes Intravenous Every 24 hours 2019-12-21 0807        REVIEW OF SYSTEMS:  ?NA Objective:  VITALS:  BP 107/68   Pulse 91   Temp 97.8 F (36.6 C) (Oral)   Resp (!) 21   Wt 56.4 kg   SpO2 96%   BMI 23.51 kg/m  PHYSICAL EXAM:  General: Ill, severe respiratory distress, 100% nonrebreather mask Head: Normocephalic, without obvious abnormality, atraumatic. Eyes: Unable to examine ENT unable to examine Neck: Supple, symmetrical, no adenopathy, thyroid: non tender no carotid bruit and no JVD. Back: No CVA tenderness. Lungs: Bilateral air entry crypts bilateral Heart: Tachycardia abdomen: Soft, non-tender,not distended. Bowel sounds normal. No masses Extremities: atraumatic, no cyanosis. No edema. No clubbing Skin: No rashes or lesions. Or bruising Lymph: Cervical, supraclavicular normal. Neurologic: Grossly non-focal Pertinent Labs Lab Results CBC    Component Value Date/Time   WBC 12.8 (H) 2019/12/21 0803   RBC 4.64 12-21-19 0803   HGB 13.4 Dec 21, 2019 0803   HCT 40.1 12/21/2019 0803   PLT 417 (H) 21-Dec-2019 0803   MCV 86.4 12-21-19 0803   MCH 28.9 12/21/19 0803   MCHC 33.4 2019-12-21 0803   RDW 12.2 Dec 21, 2019 0803   LYMPHSABS 1.5 December 21, 2019 0803   MONOABS 0.7 12-21-19 0803   EOSABS 0.0 December 21, 2019 0803   BASOSABS 0.0 12-21-2019 0803    CMP Latest Ref Rng & Units 12-21-19  Glucose 70 - 99 mg/dL 938(B)  BUN 8 - 23 mg/dL 15  Creatinine 0.17 - 5.10 mg/dL 2.58  Sodium 527 - 782 mmol/L 131(L)  Potassium 3.5 - 5.1 mmol/L 3.4(L)  Chloride 98 - 111 mmol/L 94(L)  CO2 22 - 32 mmol/L 23  Calcium 8.9 - 10.3 mg/dL 4.2(P)  Total Protein 6.5 - 8.1 g/dL 7.8  Total Bilirubin 0.3 - 1.2 mg/dL 5.3(I)  Alkaline Phos 38 - 126 U/L 90  AST 15 - 41  U/L 46(H)  ALT 0 - 44 U/L 41      Microbiology: Recent Results (from the past 240 hour(s))  Resp Panel by RT PCR (RSV, Flu A&B, Covid) -     Status: None   Collection Time: 12-21-2019  8:52 AM  Result Value Ref Range Status   SARS Coronavirus 2 by RT PCR NEGATIVE NEGATIVE Final    Comment: (NOTE) SARS-CoV-2 target nucleic acids are NOT DETECTED.  The SARS-CoV-2 RNA is generally detectable in upper respiratoy specimens during the acute phase of infection. The lowest concentration of SARS-CoV-2 viral copies this assay can detect is 131 copies/mL. A negative result does not preclude SARS-Cov-2 infection and should not be used as the sole basis for treatment or other patient management decisions. A negative result may occur with  improper specimen collection/handling, submission of specimen other than nasopharyngeal swab, presence of viral mutation(s) within the areas targeted by this assay, and inadequate number of viral copies (<131 copies/mL). A negative result must be combined with clinical observations, patient history, and epidemiological information. The expected result is Negative.  Fact Sheet for Patients:  https://www.moore.com/  Fact Sheet for Healthcare Providers:  https://www.young.biz/  This test is no t yet approved or cleared by the [place] FDA and  has been authorized for detection and/or diagnosis of SARS-CoV-2 by FDA under an Emergency Use Authorization (EUA). This EUA will remain  in effect (meaning this test can be used) for the duration of the COVID-19 declaration under Section 564(b)(1) of the Act, 21 U.S.C. section 360bbb-3(b)(1), unless the authorization is terminated or revoked sooner.  Influenza A by PCR NEGATIVE NEGATIVE Final   Influenza B by PCR NEGATIVE NEGATIVE Final    Comment: (NOTE) The Xpert Xpress SARS-CoV-2/FLU/RSV assay is intended as an aid in  the diagnosis of influenza from Nasopharyngeal  swab specimens and  should not be used as a sole basis for treatment. Nasal washings and  aspirates are unacceptable for Xpert Xpress SARS-CoV-2/FLU/RSV  testing.  Fact Sheet for Patients: https://www.moore.com/  Fact Sheet for Healthcare Providers: https://www.young.biz/  This test is not yet approved or cleared by the [place] FDA and  has been authorized for detection and/or diagnosis of SARS-CoV-2 by  FDA under an Emergency Use Authorization (EUA). This EUA will remain  in effect (meaning this test can be used) for the duration of the  Covid-19 declaration under Section 564(b)(1) of the Act, 21  U.S.C. section 360bbb-3(b)(1), unless the authorization is  terminated or revoked.    Respiratory Syncytial Virus by PCR NEGATIVE NEGATIVE Final    Comment: (NOTE) Fact Sheet for Patients: https://www.moore.com/  Fact Sheet for Healthcare Providers: https://www.young.biz/  This test is not yet approved or cleared by the [place] FDA and  has been authorized for detection and/or diagnosis of SARS-CoV-2 by  FDA under an Emergency Use Authorization (EUA). This EUA will remain  in effect (meaning this test can be used) for the duration of the  COVID-19 declaration under Section 564(b)(1) of the Act, 21 U.S.C.  section 360bbb-3(b)(1), unless the authorization is terminated or  revoked. Performed at [facility], [address] [place], [place] [address]     IMAGING RESULTS:   I have personally reviewed the films ? Impression/Recommendation ?Acute hypoxic respiratory failure with bilateral infiltrates covering the entire lungs.  SARS-CoV-2 negative  .  But this looks very much like SARS-CoV-2 viral pneumonitis versus ARDS versus bilateral groundglass scarring. In the differential is atypical bacterial pneumonia Noninfectious causes like vasculitis especially with hepatitis B need to  rule out PAN, interstitial lung disease.  She has been treated for latent TB but this does not look like tuberculosis She is  from [place] is unlikely to be millijoules [place]. Strongyloidiasis hyper infection is unlikely  Recommend keeping patient in airborne isolation Repeat SARS-CoV-2 test has been sent. Patient has been seen by pulmonologist.  Chronic hepatitis B infection.  Low-level viral load.  Hence [facility] not started treatment for her yet.  Diabetes mellitus management as per primary team.  ___________________________________________________ Discussed the management with her daughter and Dr. Meredeth Ide. Note:  This document was prepared using Dragon voice recognition software and may include unintentional dictation errors.

## 2019-12-15 NOTE — H&P (Signed)
History and Physical    Alexis Daugherty KKX:[id number] DOB: Jul 08, 1955 DOA: 12/14/2019  PCP: Department, [facility]   Patient coming from: Home  I have personally briefly reviewed patient's old medical records in [facility]  Chief Complaint: Shortness of breath  Most of the history was obtained from patient's daughter at the bedside  HPI: Alexis Daugherty is a 64 y.o. female with medical history significant for diabetes mellitus who was brought in by EMS for evaluation of fever and shortness of breath.  Per patient's daughter she has been sick for about 2 weeks but got worse in the last 24 hours before her admission with increasing weakness and shortness of breath.  She has had subjective fevers at home as well as a dry cough.  Her oral intake is poor but she denies having any nausea, vomiting, abdominal pain or any changes in her bowel habits. She has no chest pain, diaphoresis or palpitations.  She does complain of dizziness and weakness. Patient is unvaccinated against the COVID-19 virus Per EMS patient had a T-max of 100.9, room air pulse oximetry of 75% requiring nonrebreather mask with improvement in her pulse oximetry to 90%. Labs show pH 7.46/34/43/24.2/81.6 Sodium is 131, potassium 3.4, chloride 94, bicarb 23, glucose 230, BUN 15, creatinine 0.85, magnesium 2.1, alkaline phosphatase 90, albumin 3.1, AST 46, ALT 41, total protein 7.8, 0.6, BNP 78, troponin 28, lactic acid 3.4 >> 1.7, procalcitonin 1.4, white count 12.8 with a left shift, 13.4, hematocrit 40.1, MCV 86, RDW 12.2, platelet count 417, fibrin derivatives greater than 7500, TSH 0.065, T4 1.39 Respiratory viral panel is negative Urinalysis is sterile Chest x-ray reviewed by me shows bilateral multifocal pneumonia CT angiogram of the chest shows extensive bilateral airspace infiltrate in keeping with acute infection inflammation.  The findings are typical of that seen with COVID-19 pneumonia.  Extensive parenchymal involvement  . Twelve-lead EKG reviewed by me shows sinus tachycardia   ED Course: Patient is a 64 year old female with past medical history significant for diabetes mellitus who presents to the ER by EMS for evaluation of worsening shortness of breath, subjective fever, nonproductive cough and weakness.  Per EMS patient had a fever with a T-max of 100.79F, she was hypoxic with room air pulse oximetry of 75% requiring a nonrebreather mask to 90%.  Chest x-ray shows multifocal pneumonia on CT angiogram shows groundglass opacity consistent with viral pneumonia but patient has a negative COVID-19 PCR test.  She is unvaccinated against the COVID-19 virus  Review of Systems: As per HPI otherwise 10 point review of systems negative.    Past Medical History:  Diagnosis Date  . Diabetes mellitus without complication [facility])     Past Surgical History:  Procedure Laterality Date  . ABDOMINAL HYSTERECTOMY       reports that she has never smoked. She does not have any smokeless tobacco history on file. She reports that she does not drink alcohol. No history on file for drug use.  No Known Allergies  History reviewed. No pertinent family history.   Prior to Admission medications   Not on File    Physical Exam: Vitals:   11/28/2019 1115 12/10/2019 1159 12/14/2019 1200 12/16/2019 1230  BP:   104/70 108/68  Pulse: 88  91 84  Resp:      Temp:  97.8 F (36.6 C)    TempSrc:  Oral    SpO2: 95%  94% 92%  Weight:         Vitals:  April 30, 2019 1115 April 30, 2019 1159 April 30, 2019 1200 April 30, 2019 1230  BP:   104/70 108/68  Pulse: 88  91 84  Resp:      Temp:  97.8 F (36.6 C)    TempSrc:  Oral    SpO2: 95%  94% 92%  Weight:        Constitutional: NAD, alert and oriented x 3 Eyes: PERRL, lids and conjunctivae pallor ENMT: Mucous membranes are dry  Neck: normal, supple, no masses, no thyromegaly Respiratory: Scattered rhonchi, no wheezing, no crackles. Normal respiratory effort. No accessory muscle use.  Cardiovascular:  Tachycardic, no murmurs / rubs / gallops. No extremity edema. 2+ pedal pulses. No carotid bruits.  Abdomen: no tenderness, no masses palpated. No hepatosplenomegaly. Bowel sounds positive.  Musculoskeletal: no clubbing / cyanosis. No joint deformity upper and lower extremities.  Skin: no rashes, lesions, ulcers.  Neurologic: No gross focal neurologic deficit.  Generalized weakness Psychiatric: Normal mood and affect.   Labs on Admission: I have personally reviewed following labs and imaging studies  CBC: Recent Labs  Lab April 30, 2019 0803  WBC 12.8*  NEUTROABS 10.4*  HGB 13.4  HCT 40.1  MCV 86.4  PLT 417*   Basic Metabolic Panel: Recent Labs  Lab April 30, 2019 0803  NA 131*  K 3.4*  CL 94*  CO2 23  GLUCOSE 230*  BUN 15  CREATININE 0.85  CALCIUM 8.6*  MG 2.1   GFR: CrCl cannot be calculated (Unknown ideal weight.). Liver Function Tests: Recent Labs  Lab April 30, 2019 0803  AST 46*  ALT 41  ALKPHOS 90  BILITOT 1.6*  PROT 7.8  ALBUMIN 3.1*   No results for input(s): LIPASE, AMYLASE in the last 168 hours. No results for input(s): AMMONIA in the last 168 hours. Coagulation Profile: Recent Labs  Lab April 30, 2019 0803  INR 1.2   Cardiac Enzymes: No results for input(s): CKTOTAL, CKMB, CKMBINDEX, TROPONINI in the last 168 hours. BNP (last 3 results) No results for input(s): PROBNP in the last 8760 hours. HbA1C: No results for input(s): HGBA1C in the last 72 hours. CBG: Recent Labs  Lab April 30, 2019 1201  GLUCAP 175*   Lipid Profile: No results for input(s): CHOL, HDL, LDLCALC, TRIG, CHOLHDL, LDLDIRECT in the last 72 hours. Thyroid Function Tests: Recent Labs    April 30, 2019 0803  TSH 0.165*  FREET4 1.39*   Anemia Panel: No results for input(s): VITAMINB12, FOLATE, FERRITIN, TIBC, IRON, RETICCTPCT in the last 72 hours. Urine analysis:    Component Value Date/Time   COLORURINE AMBER (A) 2019/08/29 0803   APPEARANCEUR CLEAR (A) 2019/08/29 0803   LABSPEC 1.016 2019/08/29  0803   PHURINE 6.0 2019/08/29 0803   GLUCOSEU >=500 (A) 2019/08/29 0803   HGBUR SMALL (A) 2019/08/29 0803   BILIRUBINUR NEGATIVE 2019/08/29 0803   KETONESUR 20 (A) 2019/08/29 0803   PROTEINUR 100 (A) 2019/08/29 0803   NITRITE NEGATIVE 2019/08/29 0803   LEUKOCYTESUR NEGATIVE 2019/08/29 0803    Radiological Exams on Admission: DG Chest 1 View  Result Date: 2019/08/29 CLINICAL DATA:  Altered mental status.  Fever. EXAM: CHEST  1 VIEW COMPARISON:  November 17, 2016. FINDINGS: Stable cardiomediastinal silhouette. No pneumothorax or pleural effusion is noted. Multiple airspace opacities are noted throughout both lungs most consistent with multifocal pneumonia. Bony thorax is unremarkable. IMPRESSION: Bilateral multifocal pneumonia. Electronically Signed   By: Lupita RaiderJames  Green Jr M.D.   On: 2019/08/29 08:19   CT Head Wo Contrast  Result Date: 2019/08/29 CLINICAL DATA:  Altered mental status EXAM: CT HEAD WITHOUT CONTRAST TECHNIQUE: Contiguous  axial images were obtained from the base of the skull through the vertex without intravenous contrast. COMPARISON:  None. FINDINGS: Brain: Normal anatomic configuration. No abnormal intra or extra-axial mass lesion or fluid collection. No abnormal mass effect or midline shift. Remote lacunar infarct noted within the right basal ganglia. No evidence of acute intracranial hemorrhage or infarct. Ventricular size is normal. Cerebellum unremarkable. Vascular: No asymmetric hyperdense vasculature at the skull base. Moderate atherosclerotic calcification is noted within the carotid siphons bilaterally. Skull: Intact Sinuses/Orbits: Moderate mucosal thickening within the sphenoid sinuses. Mucous is noted within the visualized maxillary sinuses which is largely excluded from this examination. Remaining paranasal sinuses are clear. Orbits are unremarkable. Other: Mastoid air cells and middle ear cavities are clear. IMPRESSION: No evidence of acute intracranial hemorrhage or  infarct. Remote right basal ganglia lacunar infarct. Mild paranasal sinus disease, incompletely assessed on this examination. Electronically Signed   By: Helyn Numbers MD   On: Jan 10, 2020 11:53   CT Angio Chest PE W and/or Wo Contrast  Result Date: Jan 10, 2020 CLINICAL DATA:  Fever, cough, altered mental status EXAM: CT ANGIOGRAPHY CHEST WITH CONTRAST TECHNIQUE: Multidetector CT imaging of the chest was performed using the standard protocol during bolus administration of intravenous contrast. Multiplanar CT image reconstructions and MIPs were obtained to evaluate the vascular anatomy. CONTRAST:  24mL OMNIPAQUE IOHEXOL 350 MG/ML SOLN COMPARISON:  None. FINDINGS: Cardiovascular: Satisfactory opacification of the pulmonary arteries to the segmental level. No evidence of pulmonary embolism. The central pulmonary arteries are of normal caliber. No significant coronary artery calcification. Normal heart size. No pericardial effusion. The thoracic aorta is unremarkable. Mediastinum/Nodes: Thyroid unremarkable. Esophagus unremarkable. There is shotty mediastinal lymphadenopathy within the right paratracheal, subcarinal, and prevascular lymph node groups, possibly reactive in nature. No pathologic adenopathy within the thorax. Lungs/Pleura: There is extensive bilateral predominantly mid and lower lung zone ground-glass pulmonary infiltrate demonstrating areas of "crazy paving" within the mid lung zones, and dense consolidation within the posterior basal lower lobes bilaterally in keeping with acute atypical infection or inflammation. The findings are typical of that seen in acute to subacute COVID-19 pneumonia. There is extensive parenchymal involvement. No pneumothorax or pleural effusion. Central airways are widely patent. Upper Abdomen: No acute abnormality. Musculoskeletal: No chest wall abnormality. No acute or significant osseous findings. Review of the MIP images confirms the above findings. IMPRESSION: No  pulmonary embolism. Extensive bilateral airspace infiltrate in keeping with acute infection or inflammation. The findings are typical of that seen with COVID-19 pneumonia. Extensive parenchymal involvement noted. Electronically Signed   By: Helyn Numbers MD   On: 01/10/20 11:59    EKG: Independently reviewed.  Sinus tachycardia  Assessment/Plan Principal Problem:   Sepsis (HCC) Active Problems:   CAP (community acquired pneumonia)   Acute respiratory failure (HCC)   Diabetes mellitus (HCC)   Suspected COVID-19 virus infection     Sepsis (POA) with acute Respiratory Failure  As evidenced by fever with a T-max of 100.87F, tachycardia with heart rate of 119 and tachypnea, elevated lactic acid level of 3.4, chest x-ray findings suggestive of multifocal pneumonia and marked leukocytosis of 12K with a left shift  Patient was hypoxic in the field oximetry of 75% and initially required a nonrebreather mask with improvement in her pulse oximetry to 90%.  She is currently on high flow oxygen. Aggressive IV fluid resuscitation Place patient empirically on Rocephin and Zithromax Follow-up results of blood cultures   Acute respiratory failure Secondary to multifocal pneumonia rule out Covid pneumonia Patient  was hypoxic in the field with room air pulse oximetry of 75% and requires oxygen supplementation to maintain pulse oximetry greater than 92% High flow oxygen    Diabetes mellitus Maintain consistent carbohydrate diet Sliding scale insulin coverage    Suspected COVID-19 viral infection Patient presents to the ER for evaluation of worsening shortness of breath and weakness but has been sick for 2 weeks prior to presentation She is unvaccinated and presents for evaluation of fever, nonproductive cough, poor oral intake and hypoxia with room air pulse oximetry of 75% in the field She is currently on high flow oxygen to maintain pulse oximetry greater than 92% Chest x-ray shows  multifocal pneumonia and CT angiogram done due to significantly elevated fibrin derivatives shows extensive bilateral airspace infiltrate in keeping with acute infection or inflammation. The findings are typical of that seen with COVID-19 pneumonia. Extensive parenchymal involvement noted. Discussed with infectious disease specialist and will place patient on airborne precautions for now We will repeat respiratory viral panel We will consult pulmonology   Hypokalemia Supplement potassium    DVT prophylaxis: Lovenox Code Status: Full code Family Communication: Greater than 50% of time was spent in patient's condition and plan of care with her daughter at the bedside.  All questions and concerns have been addressed.  She verbalizes understanding and agrees with Disposition Plan: Back to previous home environment Consults called: Infectious disease/pulmonology    Alexis Dye MD [facility]     11/30/2019, 12:40 PM

## 2019-12-15 NOTE — ED Notes (Signed)
Pt cleaned of urine. New linens, gown, chux, and purewick applied.

## 2019-12-15 NOTE — ED Notes (Addendum)
MD messaged concerning oxygen saturations of 80-85% on 15 liter's hi-flow

## 2019-12-15 NOTE — ED Notes (Signed)
Visitor informed of visitor policy. Daughter concerned due to pt's language barrier. Will discuss with Consulting civil engineer.

## 2019-12-15 NOTE — ED Notes (Signed)
Non-rebreather placed over hi flow at 15 liter's

## 2019-12-15 NOTE — ED Notes (Signed)
Pt/stretcher not in room at this time

## 2019-12-15 NOTE — Consult Note (Signed)
Pulmonary Medicine          Date: January 13, 2020,   MRN# [id number] Alexis Daugherty 1955-10-29     AdmissionWeight: 56.4 kg                 CurrentWeight: 56.4 kg      CHIEF COMPLAINT:   Respiratory failure   HISTORY OF PRESENT ILLNESS  This is a 64 year old lady, daughter at bed side, who gave the history. She mentioned the patient has been having difficulty breathing, fever, dry cough, poor intake for at least two weeks. She was given fluids, and tylenol. There is no hx of aspiration, recent travel, rashes, nodes, she is diabetic, no hx of connective disease. She is not spitting up bloody sputum. Covid pcr negative. She is unvaccinated. Sats 85 % on 15 liters oxygen. Non re breather mask added.  ID is at the bed side.   Labs show pH 7.46/34/43/24.2/81.6 Sodium is 131, potassium 3.4, chloride 94, bicarb 23, glucose 230, BUN 15, creatinine 0.85, magnesium 2.1, alkaline phosphatase 90, albumin 3.1, AST 46, ALT 41, total protein 7.8, 0.6, BNP 78, troponin 28, lactic acid 3.4 >> 1.7, procalcitonin 1.4, white count 12.8 with a left shift, 13.4, hematocrit 40.1, MCV 86, RDW 12.2, platelet count 417, fibrin derivatives greater than 7500, TSH 0.065, T4 1.39 Respiratory viral panel is negative Urinalysis is sterile CHEST CT SHOWED EXTENTIVE ALVEOLAR INFITRTES BILATERALLY.  Thithos is a 64 year old non smoking, for 2 weeks has been h CHEST CT th PAST MEDICAL HISTORY   Past Medical History:  Diagnosis Date  . Diabetes mellitus without complication [facility])      SURGICAL HISTORY   Past Surgical History:  Procedure Laterality Date  . ABDOMINAL HYSTERECTOMY       FAMILY HISTORY   History reviewed. No pertinent family history.   SOCIAL HISTORY   Social History   Tobacco Use  . Smoking status: Never Smoker  Substance Use Topics  . Alcohol use: No  . Drug use: Not on file     MEDICATIONS    Home Medication:  Current Outpatient Rx  . Order #: [id number] Class: Historical  Med  . Order #: [id number] Class: Historical Med  . Order #: [id number] Class: Historical Med  . Order #: [id number] Class: Historical Med    Current Medication:  Current Facility-Administered Medications:  .  0.9 %  sodium chloride infusion, , Intravenous, Continuous, Agbata, Tochukwu, MD, Last Rate: 125 mL/hr at 13-Jan-2020 1203, New Bag at 01-13-20 1203 .  acetaminophen (TYLENOL) tablet 650 mg, 650 mg, Oral, Q6H PRN, 650 mg at 01-13-2020 1821 **OR** acetaminophen (TYLENOL) suppository 650 mg, 650 mg, Rectal, Q6H PRN, Agbata, Tochukwu, MD .  azithromycin (ZITHROMAX) 500 mg in sodium chloride 0.9 % 250 mL IVPB, 500 mg, Intravenous, Q24H, Gilles Chiquito, MD, Stopped at 01-13-2020 1015 .  cefTRIAXone (ROCEPHIN) 2 g in sodium chloride 0.9 % 100 mL IVPB, 2 g, Intravenous, Q24H, Gilles Chiquito, MD, Stopped at 2020-01-13 1015 .  enoxaparin (LOVENOX) injection 40 mg, 40 mg, Subcutaneous, Q24H, Agbata, Tochukwu, MD .  insulin aspart (novoLOG) injection 0-15 Units, 0-15 Units, Subcutaneous, TID WC, Agbata, Tochukwu, MD, 3 Units at 01-13-20 1209 .  ondansetron (ZOFRAN) tablet 4 mg, 4 mg, Oral, Q6H PRN **OR** ondansetron (ZOFRAN) injection 4 mg, 4 mg, Intravenous, Q6H PRN, Agbata, Tochukwu, MD  Current Outpatient Medications:  .  acetaminophen (TYLENOL) 325 MG tablet, Take 325-650 mg by mouth every 6 (six) hours as needed for mild pain  or fever., Disp: , Rfl:  .  ibuprofen (ADVIL) 200 MG tablet, Take 400 mg by mouth every 6 (six) hours as needed for fever or mild pain., Disp: , Rfl:  .  insulin aspart (NOVOLOG) 100 UNIT/ML FlexPen, Inject 16 Units into the skin daily before breakfast., Disp: , Rfl:  .  LANTUS SOLOSTAR 100 UNIT/ML Solostar Pen, Inject 40 Units into the skin at bedtime. (increase by 2u every three days if needed to maintain target glucose level), Disp: , Rfl:     ALLERGIES   Patient has no known allergies.     REVIEW OF SYSTEMS    Review of Systems: PER DAUGHTER  Gen:  Denies  fever,  sweats, chills weigh loss  HEENT: Denies blurred vision, double vision, ear pain, eye pain, hearing loss, nose bleeds, sore throat Cardiac:  No dizziness, chest pain or heaviness, chest tightness,edema Resp:   Denies cough or sputum porduction, shortness of breath,wheezing, hemoptysis,  Gi: Denies swallowing difficulty, stomach pain, nausea or vomiting, diarrhea, constipation, bowel incontinence Gu:  Denies bladder incontinence, burning urine Ext:   Denies Joint pain, stiffness or swelling Skin: Denies  skin rash, easy bruising or bleeding or hives Endoc:  Denies polyuria, polydipsia , polyphagia or weight change Psych:   Denies depression, insomnia or hallucinations   Other:  All other systems negative   VS: BP (!) 160/85   Pulse (!) 121   Temp (!) 101.7 F (38.7 C) (Oral)   Resp (!) 21   Wt 56.4 kg   SpO2 92%   BMI 23.51 kg/m      PHYSICAL EXAM    GENERAL:moderate-severe distress  +fevers, ++chills,  weakness SMALL FRAME HEAD: Normocephalic, atraumatic.  EYES: Pupils equal, round, reactive to light. Extraocular muscles intact. No scleral icterus.  MOUTH: Moist mucosal membrane. Poor dentition.  EAR, NOSE, THROAT: Clear without exudates. No external lesions.  NECK: Supple. No thyromegaly. No nodules. No JVD.  PULMONARY: Diffuse coarse rhonchi right sided + rare wheezes CARDIOVASCULAR: S1 and S2. Regular rate and rhythm. No murmurs, rubs, or gallops. No edema. Pedal pulses 2+ bilaterally.  GASTROINTESTINAL: Soft, nontender, nondistended. No masses. Positive bowel sounds. No hepatosplenomegaly.  MUSCULOSKELETAL: No swelling, clubbing, or edema. Range of motion full in all extremities.  NEUROLOGIC: Cranial nerves II through XII are intact. No gross focal neurological deficits. Sensation intact. Reflexes intact.  SKIN: No ulceration, lesions, rashes, or cyanosis. Skin warm and dry. Turgor intact.  PSYCHIATRIC: The patient is awake, .       IMAGING    DG Chest 1  View  Result Date: 01/04/2020 CLINICAL DATA:  Altered mental status.  Fever. EXAM: CHEST  1 VIEW COMPARISON:  November 17, 2016. FINDINGS: Stable cardiomediastinal silhouette. No pneumothorax or pleural effusion is noted. Multiple airspace opacities are noted throughout both lungs most consistent with multifocal pneumonia. Bony thorax is unremarkable. IMPRESSION: Bilateral multifocal pneumonia. Electronically Signed   By: Lupita Raider M.D.   On: Jan 04, 2020 08:19   CT Head Wo Contrast  Result Date: 01/04/2020 CLINICAL DATA:  Altered mental status EXAM: CT HEAD WITHOUT CONTRAST TECHNIQUE: Contiguous axial images were obtained from the base of the skull through the vertex without intravenous contrast. COMPARISON:  None. FINDINGS: Brain: Normal anatomic configuration. No abnormal intra or extra-axial mass lesion or fluid collection. No abnormal mass effect or midline shift. Remote lacunar infarct noted within the right basal ganglia. No evidence of acute intracranial hemorrhage or infarct. Ventricular size is normal. Cerebellum unremarkable. Vascular: No asymmetric hyperdense  vasculature at the skull base. Moderate atherosclerotic calcification is noted within the carotid siphons bilaterally. Skull: Intact Sinuses/Orbits: Moderate mucosal thickening within the sphenoid sinuses. Mucous is noted within the visualized maxillary sinuses which is largely excluded from this examination. Remaining paranasal sinuses are clear. Orbits are unremarkable. Other: Mastoid air cells and middle ear cavities are clear. IMPRESSION: No evidence of acute intracranial hemorrhage or infarct. Remote right basal ganglia lacunar infarct. Mild paranasal sinus disease, incompletely assessed on this examination. Electronically Signed   By: Helyn Numbers MD   On: 12/19/2019 11:53   CT Angio Chest PE W and/or Wo Contrast  Result Date: 11/30/2019 CLINICAL DATA:  Fever, cough, altered mental status EXAM: CT ANGIOGRAPHY CHEST WITH  CONTRAST TECHNIQUE: Multidetector CT imaging of the chest was performed using the standard protocol during bolus administration of intravenous contrast. Multiplanar CT image reconstructions and MIPs were obtained to evaluate the vascular anatomy. CONTRAST:  40mL OMNIPAQUE IOHEXOL 350 MG/ML SOLN COMPARISON:  None. FINDINGS: Cardiovascular: Satisfactory opacification of the pulmonary arteries to the segmental level. No evidence of pulmonary embolism. The central pulmonary arteries are of normal caliber. No significant coronary artery calcification. Normal heart size. No pericardial effusion. The thoracic aorta is unremarkable. Mediastinum/Nodes: Thyroid unremarkable. Esophagus unremarkable. There is shotty mediastinal lymphadenopathy within the right paratracheal, subcarinal, and prevascular lymph node groups, possibly reactive in nature. No pathologic adenopathy within the thorax. Lungs/Pleura: There is extensive bilateral predominantly mid and lower lung zone ground-glass pulmonary infiltrate demonstrating areas of "crazy paving" within the mid lung zones, and dense consolidation within the posterior basal lower lobes bilaterally in keeping with acute atypical infection or inflammation. The findings are typical of that seen in acute to subacute COVID-19 pneumonia. There is extensive parenchymal involvement. No pneumothorax or pleural effusion. Central airways are widely patent. Upper Abdomen: No acute abnormality. Musculoskeletal: No chest wall abnormality. No acute or significant osseous findings. Review of the MIP images confirms the above findings. IMPRESSION: No pulmonary embolism. Extensive bilateral airspace infiltrate in keeping with acute infection or inflammation. The findings are typical of that seen with COVID-19 pneumonia. Extensive parenchymal involvement noted. Electronically Signed   By: Helyn Numbers MD   On: 11/29/2019 11:59      ASSESSMENT/PLAN   This is a very sick lady, very hypoxic ar  risk of ending up on the ventilator. Her chest ct showed practically a 5 lobe alveolar infiltrates, very broad differential. ? Post covid lung, cap, vs non infectious cause (ctd, IPF flare, alv. Hem/vasculitis etc) w/u in progress. No a candidate for monoclonal antibodies or covid antivirals -solumedrol 60 mg tid -broad spectrum antibiotics for now per ID -support on high flow/re breather, bipap, vent per response -ck hiv, urine legionella ag, ana, anca, ra, complement 3 and 4, quantiferon ab  -dvt prophylaxis    Thank you for allowing me to participate in the care of this patient.   Patient/Family are satisfied with care plan and all questions have been answered.  This document was prepared using Dragon voice recognition software and may include unintentional dictation errors.     Ned Clines, M.D.  Division of Pulmonary & Critical Care Medicine  [facility]

## 2019-12-15 NOTE — ED Notes (Signed)
Lab called to inquire about covid re-send. Lab states they will process.

## 2019-12-15 NOTE — ED Notes (Signed)
Pt given dinner tray and additional warm blankets.

## 2019-12-15 NOTE — ED Notes (Signed)
Pulmonology at bedside.

## 2019-12-15 NOTE — ED Provider Notes (Signed)
[facility] Emergency Department Provider Note  ____________________________________________   First MD Initiated Contact with Patient 12/02/2019 0805     (approximate)  I have reviewed the triage vital signs and the nursing notes.   HISTORY  Chief Complaint Altered Mental Status   HPI Alexis Daugherty is a 64 y.o. female with a past medical history of DM who presents via EMS from home for assessment of fever and altered mental status.  Per EMS patient has reported been feeling poorly for around 2 weeks but per family at patient's home got acutely worse over the last 24 hours and more confused.  I did attempt to obtain some history from the patient who speaks [place] Glad is seemingly altered and unable to participate in any conversation.  Shortly after patient arrival patient daughter arrived at bedside who relates that the patient has been feeling poorly for actually 4 days with fever and cough no vomiting or diarrhea other clear symptoms.  Patient has not been vaccinated against Covid.  She has no known drug allergies.  Other than diabetes medications she does not take any daily medications or smoke.  No other history is immediately available on patient arrival.         Past Medical History:  Diagnosis Date  . Diabetes mellitus without complication ([place])     There are no problems to display for this patient.   Past Surgical History:  Procedure Laterality Date  . ABDOMINAL HYSTERECTOMY      Prior to Admission medications   Not on File    Allergies Patient has no known allergies.  History reviewed. No pertinent family history.  Social History Social History   Tobacco Use  . Smoking status: Never Smoker  Substance Use Topics  . Alcohol use: No  . Drug use: Not on file    Review of Systems  Review of Systems  Unable to perform ROS: Mental status change      ____________________________________________   PHYSICAL EXAM:  VITAL SIGNS: ED Triage  Vitals  Enc Vitals Group     BP      Pulse      Resp      Temp      Temp src      SpO2      Weight      Height      Head Circumference      Peak Flow      Pain Score      Pain Loc      Pain Edu?      Excl. in [place]?    Vitals:   12/23/2019 1015 12/27/2019 1100  BP: 102/70 102/70  Pulse: 89 88  Resp:    Temp:    SpO2: 93% 94%   Physical Exam Vitals and nursing note reviewed.  Constitutional:      General: She is in acute distress.     Appearance: She is well-developed. She is ill-appearing.  HENT:     Head: Normocephalic and atraumatic.     Right Ear: External ear normal.     Left Ear: External ear normal.     Nose: Nose normal.     Mouth/Throat:     Mouth: Mucous membranes are dry.  Eyes:     Conjunctiva/sclera: Conjunctivae normal.  Cardiovascular:     Rate and Rhythm: Regular rhythm. Tachycardia present.     Heart sounds: No murmur heard.   Pulmonary:     Effort: Tachypnea and respiratory distress present.  Breath sounds: Examination of the right-middle field reveals rhonchi. Examination of the left-middle field reveals rhonchi. Examination of the right-lower field reveals rhonchi. Examination of the left-lower field reveals rhonchi. Rhonchi present.  Abdominal:     Palpations: Abdomen is soft.     Tenderness: There is no abdominal tenderness.  Musculoskeletal:     Cervical back: Neck supple.  Skin:    General: Skin is warm and dry.     Capillary Refill: Capillary refill takes 2 to 3 seconds.  Neurological:     General: No focal deficit present.     Mental Status: She is alert.     PERRLA.  EOMI.  Patient moves all extremities spontaneously.  There is no areas over the bilateral upper or lower extremities, chest, abdomen, back, face, or neck that is erythematous, edematous, fluctuant, or otherwise clearly abnormal.  2+ bilateral radial and DP pulses. ____________________________________________   LABS (all labs ordered are listed, but only abnormal results  are displayed)  Labs Reviewed  LACTIC ACID, PLASMA - Abnormal; Notable for the following components:      Result Value   Lactic Acid, Venous 3.4 (*)    All other components within normal limits  COMPREHENSIVE METABOLIC PANEL - Abnormal; Notable for the following components:   Sodium 131 (*)    Potassium 3.4 (*)    Chloride 94 (*)    Glucose, Bld 230 (*)    Calcium 8.6 (*)    Albumin 3.1 (*)    AST 46 (*)    Total Bilirubin 1.6 (*)    All other components within normal limits  CBC WITH DIFFERENTIAL/PLATELET - Abnormal; Notable for the following components:   WBC 12.8 (*)    Platelets 417 (*)    Neutro Abs 10.4 (*)    Abs Immature Granulocytes 0.21 (*)    All other components within normal limits  URINALYSIS, COMPLETE (UACMP) WITH MICROSCOPIC - Abnormal; Notable for the following components:   Color, Urine AMBER (*)    APPearance CLEAR (*)    Glucose, UA >=500 (*)    Hgb urine dipstick SMALL (*)    Ketones, ur 20 (*)    Protein, ur 100 (*)    All other components within normal limits  BLOOD GAS, VENOUS - Abnormal; Notable for the following components:   pH, Ven 7.46 (*)    pCO2, Ven 34 (*)    All other components within normal limits  TSH - Abnormal; Notable for the following components:   TSH 0.165 (*)    All other components within normal limits  FIBRIN DERIVATIVES D-DIMER ([facility] ONLY) - Abnormal; Notable for the following components:   Fibrin derivatives D-dimer ([facility]) >7,500.00 (*)    All other components within normal limits  T4, FREE - Abnormal; Notable for the following components:   Free T4 1.39 (*)    All other components within normal limits  TROPONIN I (HIGH SENSITIVITY) - Abnormal; Notable for the following components:   Troponin I (High Sensitivity) 20 (*)    All other components within normal limits  RESP PANEL BY RT PCR (RSV, FLU A&B, COVID)  CULTURE, BLOOD (ROUTINE X 2)  CULTURE, BLOOD (ROUTINE X 2)  URINE CULTURE  LACTIC ACID, PLASMA  PROTIME-INR    APTT  BRAIN NATRIURETIC PEPTIDE  MAGNESIUM  PROCALCITONIN  HEMOGLOBIN A1C  TROPONIN I (HIGH SENSITIVITY)   ____________________________________________  EKG  Sinus tachycardia with a ventricular rate of 126, normal axis, unremarkable intervals, significant artifact in anterior and septal leads  with some nonspecific ST changes in the inferior and lateral leads. ____________________________________________  RADIOLOGY  ED MD interpretation: Bilateral multifocal pneumonia without evidence of pneumothorax, large effusion, significant edema, or other clear acute intrathoracic process.  Official radiology report(s): DG Chest 1 View  Result Date: 12/12/2019 CLINICAL DATA:  Altered mental status.  Fever. EXAM: CHEST  1 VIEW COMPARISON:  November 17, 2016. FINDINGS: Stable cardiomediastinal silhouette. No pneumothorax or pleural effusion is noted. Multiple airspace opacities are noted throughout both lungs most consistent with multifocal pneumonia. Bony thorax is unremarkable. IMPRESSION: Bilateral multifocal pneumonia. Electronically Signed   By: Marijo Conception M.D.   On: 12/06/2019 08:19    ____________________________________________   PROCEDURES  Procedure(s) performed (including Critical Care):  .Critical Care Performed by: Lucrezia Starch, MD Authorized by: Lucrezia Starch, MD   Critical care provider statement:    Critical care time (minutes):  45   Critical care time was exclusive of:  Separately billable procedures and treating other patients   Critical care was necessary to treat or prevent imminent or life-threatening deterioration of the following conditions:  Respiratory failure and sepsis   Critical care was time spent personally by me on the following activities:  Discussions with consultants, evaluation of patient's response to treatment, examination of patient, ordering and performing treatments and interventions, ordering and review of laboratory studies,  ordering and review of radiographic studies, pulse oximetry, re-evaluation of patient's condition, obtaining history from patient or surrogate and review of old charts  .1-3 Lead EKG Interpretation Performed by: Lucrezia Starch, MD Authorized by: Lucrezia Starch, MD     Interpretation: normal     ECG rate assessment: normal     Rhythm: sinus rhythm     Ectopy: none     Conduction: normal       ____________________________________________   INITIAL IMPRESSION / ASSESSMENT AND PLAN / ED COURSE        Patient presents above to history exam via EMS from home for assessment of altered mental status in the setting of fever and cough.  Per EMS patient had an SPO2 saturation of 75% on room air on arrival but this improved to the high 80s with a nonrebreather. On arrival patient is known to be tachycardic with a heart rate of 127, febrile with a temperature of 100.8, tachypneic to 21, and having an SPO2 saturation of 91% on nonrebreather.  On exam patient is warm with delayed cap refill but is moving all extremities spontaneously.  She has bilateral rhonchi and appears altered as she is on unable or unwilling to converse with translator or daughter at bedside.  Differential includes but is not limited to sepsis and hypoxic respiratory failure secondary to COVID-19 versus other infectious source.  No history or exam findings to suggest acute traumatic injury or toxic ingestion.  We will also check thyroid and troponin to assess for evidence of thyrotoxicosis or ACS/myocarditis.  I will plan to obtain a CTA chest given patient's D-dimer is greater than 7500.  Patient has some nonspecific ST changes on her ECG and her troponin is 20 low suspicion for ACS at this time I will plan to obtain a repeat troponin.  Patient does not appear volume overloaded and her BNP is not elevated and I will suspicion for heart failure at this time.  VBG shows no evidence of hypercarbic respiratory failure.  CMP shows  mild hypokalemia as well as a glucose of 230 without evidence of acidosis.  CBC shows a  leukocytosis with a WBC count of 12.8 otherwise is unremarkable.  Urine has 20 ketones and 100 protein but does not appear infected.  Also plan to obtain a CT head to assess for intracranial normalities I suspect patient's confusion and encephalopathy is likely secondary to sepsis from pneumonia.  Given multiple SIRS criteria met on arrival with concerns for possible respiratory source for infection blood cultures were immediately obtained and IV antibiotics ordered.  In addition patient was given IV fluid bolus and Tylenol.  I will plan to admit to hospital service for further evaluation and management.   ____________________________________________   FINAL CLINICAL IMPRESSION(S) / ED DIAGNOSES  Final diagnoses:  Acute respiratory failure with hypoxia ([place])  Community acquired pneumonia, unspecified laterality  Lactic acid acidosis  Positive D dimer  Sepsis, due to unspecified organism, unspecified whether acute organ dysfunction present ([place])  Hyperglycemia  Troponin I above reference range  Hypokalemia    Medications  cefTRIAXone (ROCEPHIN) 2 g in sodium chloride 0.9 % 100 mL IVPB (0 g Intravenous Stopped 12/23/2019 1015)  azithromycin (ZITHROMAX) 500 mg in sodium chloride 0.9 % 250 mL IVPB (0 mg Intravenous Stopped 12/13/2019 1015)  insulin aspart (novoLOG) injection 0-15 Units (5 Units Subcutaneous Given 12/25/2019 1057)  potassium & sodium phosphates (PHOS-NAK) 280-160-250 MG packet 1 packet (has no administration in time range)  acetaminophen (TYLENOL) tablet 1,000 mg (1,000 mg Oral Given 12/04/2019 0820)  lactated ringers bolus 1,000 mL (0 mLs Intravenous Stopped 12/08/2019 1054)     ED Discharge Orders    None       Note:  This document was prepared using Dragon voice recognition software and may include unintentional dictation errors.   Lucrezia Starch, MD 12/10/2019 1114

## 2019-12-16 ENCOUNTER — Inpatient Hospital Stay: Payer: Medicare Other

## 2019-12-16 DIAGNOSIS — R6520 Severe sepsis without septic shock: Secondary | ICD-10-CM | POA: Diagnosis not present

## 2019-12-16 DIAGNOSIS — J9602 Acute respiratory failure with hypercapnia: Secondary | ICD-10-CM

## 2019-12-16 DIAGNOSIS — K739 Chronic hepatitis, unspecified: Secondary | ICD-10-CM

## 2019-12-16 DIAGNOSIS — J9601 Acute respiratory failure with hypoxia: Secondary | ICD-10-CM | POA: Diagnosis not present

## 2019-12-16 DIAGNOSIS — E119 Type 2 diabetes mellitus without complications: Secondary | ICD-10-CM | POA: Diagnosis not present

## 2019-12-16 DIAGNOSIS — J1282 Pneumonia due to coronavirus disease 2019: Secondary | ICD-10-CM

## 2019-12-16 DIAGNOSIS — A4189 Other specified sepsis: Secondary | ICD-10-CM | POA: Diagnosis not present

## 2019-12-16 DIAGNOSIS — U071 COVID-19: Secondary | ICD-10-CM | POA: Diagnosis not present

## 2019-12-16 DIAGNOSIS — E876 Hypokalemia: Secondary | ICD-10-CM | POA: Diagnosis not present

## 2019-12-16 DIAGNOSIS — J80 Acute respiratory distress syndrome: Secondary | ICD-10-CM | POA: Diagnosis not present

## 2019-12-16 DIAGNOSIS — A419 Sepsis, unspecified organism: Secondary | ICD-10-CM | POA: Diagnosis not present

## 2019-12-16 DIAGNOSIS — E0800 Diabetes mellitus due to underlying condition with hyperosmolarity without nonketotic hyperglycemic-hyperosmolar coma (NKHHC): Secondary | ICD-10-CM | POA: Diagnosis not present

## 2019-12-16 LAB — CBC
HCT: 36.7 % (ref 36.0–46.0)
Hemoglobin: 12.3 g/dL (ref 12.0–15.0)
MCH: 29.4 pg (ref 26.0–34.0)
MCHC: 33.5 g/dL (ref 30.0–36.0)
MCV: 87.6 fL (ref 80.0–100.0)
Platelets: 361 10*3/uL (ref 150–400)
RBC: 4.19 MIL/uL (ref 3.87–5.11)
RDW: 12.4 % (ref 11.5–15.5)
WBC: 14.1 10*3/uL — ABNORMAL HIGH (ref 4.0–10.5)
nRBC: 0 % (ref 0.0–0.2)

## 2019-12-16 LAB — PROTIME-INR
INR: 1.3 — ABNORMAL HIGH (ref 0.8–1.2)
Prothrombin Time: 16.1 seconds — ABNORMAL HIGH (ref 11.4–15.2)

## 2019-12-16 LAB — BASIC METABOLIC PANEL
Anion gap: 13 (ref 5–15)
BUN: 16 mg/dL (ref 8–23)
CO2: 18 mmol/L — ABNORMAL LOW (ref 22–32)
Calcium: 8.1 mg/dL — ABNORMAL LOW (ref 8.9–10.3)
Chloride: 104 mmol/L (ref 98–111)
Creatinine, Ser: 0.6 mg/dL (ref 0.44–1.00)
GFR, Estimated: >60 mL/min (ref >60)
Glucose, Bld: 229 mg/dL — ABNORMAL HIGH (ref 70–99)
Potassium: 4 mmol/L (ref 3.5–5.1)
Sodium: 135 mmol/L (ref 135–145)

## 2019-12-16 LAB — BLOOD GAS, ARTERIAL
Acid-base deficit: 7.9 mmol/L — ABNORMAL HIGH (ref 0.0–2.0)
Bicarbonate: 22.1 mmol/L (ref 20.0–28.0)
FIO2: 1
MECHVT: 420 mL
O2 Saturation: 99.5 %
PEEP: 10 cmH2O
Patient temperature: 37
RATE: 20 resp/min
pCO2 arterial: 65 mmHg — ABNORMAL HIGH (ref 32.0–48.0)
pH, Arterial: 7.14 — CL (ref 7.350–7.450)
pO2, Arterial: 202 mmHg — ABNORMAL HIGH (ref 83.0–108.0)

## 2019-12-16 LAB — GLUCOSE, CAPILLARY
Glucose-Capillary: 240 mg/dL — ABNORMAL HIGH (ref 70–99)
Glucose-Capillary: 251 mg/dL — ABNORMAL HIGH (ref 70–99)

## 2019-12-16 LAB — PROCALCITONIN: Procalcitonin: 1.59 ng/mL

## 2019-12-16 LAB — MRSA PCR SCREENING: MRSA by PCR: NEGATIVE

## 2019-12-16 LAB — CORTISOL-AM, BLOOD: Cortisol - AM: 12.4 ug/dL (ref 6.7–22.6)

## 2019-12-16 LAB — HIV ANTIBODY (ROUTINE TESTING W REFLEX): HIV Screen 4th Generation wRfx: NONREACTIVE

## 2019-12-16 MED ORDER — ORAL CARE MOUTH RINSE
15.0000 mL | OROMUCOSAL | Status: DC
  Administered 2019-12-17 – 2019-12-20 (×32): 15 mL via OROMUCOSAL

## 2019-12-16 MED ORDER — FENTANYL 2500MCG IN NS 250ML (10MCG/ML) PREMIX INFUSION
25.0000 ug/h | INTRAVENOUS | Status: DC
  Administered 2019-12-16: 50 ug/h via INTRAVENOUS
  Administered 2019-12-17: 125 ug/h via INTRAVENOUS
  Administered 2019-12-18: 200 ug/h via INTRAVENOUS
  Administered 2019-12-18: 125 ug/h via INTRAVENOUS
  Administered 2019-12-19 – 2019-12-20 (×2): 200 ug/h via INTRAVENOUS
  Filled 2019-12-16 (×6): qty 250

## 2019-12-16 MED ORDER — VECURONIUM BROMIDE 10 MG IV SOLR
INTRAVENOUS | Status: AC
  Administered 2019-12-16: 20 mg via INTRAVENOUS
  Filled 2019-12-16: qty 10

## 2019-12-16 MED ORDER — POLYETHYLENE GLYCOL 3350 17 G PO PACK
17.0000 g | PACK | Freq: Every day | ORAL | Status: DC
  Administered 2019-12-16 – 2019-12-19 (×4): 17 g via ORAL
  Filled 2019-12-16 (×4): qty 1

## 2019-12-16 MED ORDER — MIDAZOLAM HCL 2 MG/2ML IJ SOLN: 4.0000 mg | Freq: Once | INTRAMUSCULAR | Status: AC

## 2019-12-16 MED ORDER — DOCUSATE SODIUM 50 MG/5ML PO LIQD
100.0000 mg | Freq: Two times a day (BID) | ORAL | Status: DC
  Administered 2019-12-16 – 2019-12-19 (×7): 100 mg via ORAL
  Filled 2019-12-16 (×7): qty 10

## 2019-12-16 MED ORDER — VECURONIUM BROMIDE 10 MG IV SOLR
INTRAVENOUS | Status: AC
  Filled 2019-12-16: qty 10

## 2019-12-16 MED ORDER — LORAZEPAM 2 MG/ML IJ SOLN
0.5000 mg | Freq: Once | INTRAMUSCULAR | Status: AC
  Administered 2019-12-16: 0.5 mg via INTRAVENOUS
  Filled 2019-12-16: qty 1

## 2019-12-16 MED ORDER — FENTANYL CITRATE (PF) 100 MCG/2ML IJ SOLN
25.0000 ug | Freq: Once | INTRAMUSCULAR | Status: AC
  Administered 2019-12-19: 25 ug via INTRAVENOUS
  Filled 2019-12-16: qty 2

## 2019-12-16 MED ORDER — VECURONIUM BROMIDE 10 MG IV SOLR: 20.0000 mg | Freq: Once | INTRAVENOUS | Status: AC

## 2019-12-16 MED ORDER — FENTANYL BOLUS VIA INFUSION
25.0000 ug | INTRAVENOUS | Status: DC | PRN
  Administered 2019-12-18 (×2): 25 ug via INTRAVENOUS
  Filled 2019-12-16: qty 25

## 2019-12-16 MED ORDER — PROPOFOL 1000 MG/100ML IV EMUL
0.0000 ug/kg/min | INTRAVENOUS | Status: DC
  Administered 2019-12-16: 15 ug/kg/min via INTRAVENOUS
  Administered 2019-12-17 (×3): 30 ug/kg/min via INTRAVENOUS
  Filled 2019-12-16 (×5): qty 100

## 2019-12-16 MED ORDER — CHLORHEXIDINE GLUCONATE 0.12% ORAL RINSE (MEDLINE KIT)
15.0000 mL | Freq: Two times a day (BID) | OROMUCOSAL | Status: DC
  Administered 2019-12-16 – 2019-12-19 (×7): 15 mL via OROMUCOSAL

## 2019-12-16 MED ORDER — CHLORHEXIDINE GLUCONATE CLOTH 2 % EX PADS
6.0000 | MEDICATED_PAD | Freq: Every day | CUTANEOUS | Status: DC
  Administered 2019-12-16 – 2019-12-19 (×4): 6 via TOPICAL

## 2019-12-16 MED ORDER — FENTANYL CITRATE (PF) 100 MCG/2ML IJ SOLN
INTRAMUSCULAR | Status: AC
  Administered 2019-12-16: 200 ug via INTRAVENOUS
  Filled 2019-12-16: qty 4

## 2019-12-16 MED ORDER — MIDAZOLAM HCL 2 MG/2ML IJ SOLN
INTRAMUSCULAR | Status: AC
  Administered 2019-12-16: 4 mg via INTRAVENOUS
  Filled 2019-12-16: qty 4

## 2019-12-16 MED ORDER — FENTANYL CITRATE (PF) 100 MCG/2ML IJ SOLN: 200.0000 ug | Freq: Once | INTRAMUSCULAR | Status: AC

## 2019-12-16 NOTE — Significant Event (Signed)
Rapid Response Event Note   Reason for Call :  Respiratory distress right after arrival from ED  Initial Focused Assessment:  Rapid response RN arrived in patient's room with 2A staff and RT at bedside. Patient with labored breathing and tachypneic in the upper 40s low 50s RR. Oxygen saturations in mid 80s on heated HFNC at 100 % FiO2 and NRB overtop that. RT put patient on bipap at 100% FiO2 which improved oxygen saturations into the low 90s but did not improved respiratory effort or number of respirations.  Interventions:  Patient to transfer to ICU 14.  Plan of Care:  Patient transferred to ICU. Dr. Belia Heman got report from Dr. Mayford Knife. He assessed patient upon her arrival and emergently intubated her with 200 mcg of fentanyl IV, 4 mg of versed IV, and 20 mg of vercuronium. RTs and RN called patient's family to let them talk to her on the phone prior to intubation. Dr. Belia Heman updated family. OG and foley catheter placed per orders. Fentanyl infusion started for sedation post intubation.   Event Summary:   MD Notified: 1725 Call Time: 1719 Arrival Time: 1723 (rapid response RN had been in ultrasound with a patient) End Time: 1805  Madlyn Crosby, Theresia Lo, RN

## 2019-12-16 NOTE — ED Notes (Signed)
Pt saturations 80% on high flow nasal cannula. Respiratory called by this RN. Pt respirations remain labored and at a rate 40 per minute at this time. Pt readjusted in bed and set to a 45 degree angle by this RN. Pt oxygen saturation now 88-89%. This RN will continue to monitor.

## 2019-12-16 NOTE — ED Notes (Signed)
Pt alarming due to respiratory distress. Pt sat straight up in bed on non-re breather as well as nasal cannula. Pt O2 sat at 78%. Respiratory called as well as MD paged.

## 2019-12-16 NOTE — Procedures (Signed)
Endotracheal Intubation: Patient required placement of an artificial airway secondary to Respiratory Failure  Consent: Emergent.   Hand washing performed prior to starting the procedure.   Medications administered for sedation prior to procedure:  Midazolam 4 mg IV,  Vecuronium 10 mg IV, Fentanyl 100 mcg IV.    A time out procedure was called and correct patient, name, & ID confirmed. Needed supplies and equipment were assembled and checked to include ETT, 10 ml syringe, Glidescope, Mac and Miller blades, suction, oxygen and bag mask valve, end tidal CO2 monitor.   Patient was positioned to align the mouth and pharynx to facilitate visualization of the glottis.   Heart rate, SpO2 and blood pressure was continuously monitored during the procedure. Pre-oxygenation was conducted prior to intubation and endotracheal tube was placed through the vocal cords into the trachea.     The artificial airway was placed under direct visualization via glidescope route using a 8.0 ETT on the first attempt.  ETT was secured at 23 cm mark.  Placement was confirmed by auscuitation of lungs with good breath sounds bilaterally and no stomach sounds.  Condensation was noted on endotracheal tube.   Pulse ox 98%.  CO2 detector in place with appropriate color change.   Complications: None .   Operator: Amine Adelson.   Chest radiograph ordered and pending.    Alexis Daugherty, M.D.  [facility]  Medical Director ICU-[facility] [place] Medical Director [facility] Cardio-Pulmonary Department       

## 2019-12-16 NOTE — Progress Notes (Signed)
PROGRESS NOTE    Alexis Daugherty  WYO:[id number] DOB: 1955/12/16 DOA: 12/23/2019 PCP: Department, [place]:   Principal Problem:   Sepsis ([place]) Active Problems:   CAP (community acquired pneumonia)   Acute respiratory failure ([place])   Diabetes mellitus ([place])   Suspected COVID-19 virus infection   Sepsis: likely secondary to [place]. Met criteria w/ fever 100.35F, tachycardia, tachypnea, leukocytosis, elevated lactic acid level, chest x-ray findings suggestive of pneumonia. Continue on HFNC and wean as tolerated.    COVID-19 pneumonia: unvaccinated. Continue on HFNC and wean as tolerated. Started on remdesivir, steroids. Will check inflammatory markers. Encourage incentive spirometry. CTA neg for pulmonary embolism but showed typical COVID19 pneumonia. [place] & contact precautions. Evidently pt stays at home mostly but other family members have been coming in and out of the same house. W/ possible bacterial co-infection. Pro-cal is elevated. Continue on IV azithromycin, ceftriaxone for now   Acute hypoxic respiratory failure: secondary to COVID19 pneumonia. Continue on HFNC and wean as tolerated  Lactic acidosis: resolved  DM2: poorly controlled. Continue on SSI w/ accuchecks   Hypokalemia: WNL today. Will continue to monitor   DVT prophylaxis: lovenox  Code Status: full  Family Communication: discussed pt's care w/ pt's daughter who is at bedside and answered her questions Disposition Plan: depends on PT/OT recs (not consulted yet)  Status is: Inpatient  Remains inpatient appropriate because:Hemodynamically unstable, IV treatments appropriate due to intensity of illness or inability to take PO and Inpatient level of care appropriate due to severity of illness   Dispo: The patient is from: Home              Anticipated d/c is to: SNF              Anticipated d/c date is: > 3 days              Patient currently is not medically stable to  d/c.        Consultants:      Procedures:    Antimicrobials: azithromycin, ceftriaxone   Subjective: Pt c/o shortness of breath   Objective: Vitals:   12/16/19 0600 12/16/19 0650 12/16/19 0658 12/16/19 0739  BP:   (!) 130/98   Pulse: 86  (!) 101   Resp:   (!) 34   Temp:    98.1 F (36.7 C)  TempSrc:    Oral  SpO2:  90% 92%   Weight:       No intake or output data in the 24 hours ending 12/16/19 0908 Filed Weights   12/16/2019 0811  Weight: 56.4 kg    Examination:  General exam: Appears calm but uncomfortable  Respiratory system: course breath sounds b/l  Cardiovascular system: S1 & S2 +. No rubs, gallops or clicks.. Gastrointestinal system: Abdomen is nondistended, soft and nontender. Normal bowel sounds heard. Central nervous system: Alert and oriented. Moves all 4 extremities  Psychiatry: Judgement and insight appear normal. Flat mood and affect    Data Reviewed: I have personally reviewed following labs and imaging studies  CBC: Recent Labs  Lab 12/21/2019 0803 12/16/19 0443  WBC 12.8* 14.1*  NEUTROABS 10.4*  --   HGB 13.4 12.3  HCT 40.1 36.7  MCV 86.4 87.6  PLT 417* 774   Basic Metabolic Panel: Recent Labs  Lab 12/11/2019 0803 12/16/19 0443  NA 131* 135  K 3.4* 4.0  CL 94* 104  CO2 23 18*  GLUCOSE 230* 229*  BUN 15 16  CREATININE 0.85 0.60  CALCIUM 8.6* 8.1*  MG 2.1  --    GFR: CrCl cannot be calculated (Unknown ideal weight.). Liver Function Tests: Recent Labs  Lab 12/09/2019 0803  AST 46*  ALT 41  ALKPHOS 90  BILITOT 1.6*  PROT 7.8  ALBUMIN 3.1*   No results for input(s): LIPASE, AMYLASE in the last 168 hours. No results for input(s): AMMONIA in the last 168 hours. Coagulation Profile: Recent Labs  Lab 12/10/2019 0803 12/16/19 0443  INR 1.2 1.3*   Cardiac Enzymes: No results for input(s): CKTOTAL, CKMB, CKMBINDEX, TROPONINI in the last 168 hours. BNP (last 3 results) No results for input(s): PROBNP in the last 8760  hours. HbA1C: Recent Labs    12/11/2019 1039  HGBA1C 10.8*   CBG: Recent Labs  Lab 11/30/2019 1201 12/09/2019 1513 12/25/2019 1825 12/27/2019 2007 12/16/19 0735  GLUCAP 175* 75 140* 160* 240*   Lipid Profile: No results for input(s): CHOL, HDL, LDLCALC, TRIG, CHOLHDL, LDLDIRECT in the last 72 hours. Thyroid Function Tests: Recent Labs    12/19/2019 0803  TSH 0.165*  FREET4 1.39*   Anemia Panel: No results for input(s): VITAMINB12, FOLATE, FERRITIN, TIBC, IRON, RETICCTPCT in the last 72 hours. Sepsis Labs: Recent Labs  Lab 12/24/2019 0803 12/16/2019 0807 12/27/2019 1039 12/16/19 0443  PROCALCITON 0.14  --   --  1.59  LATICACIDVEN  --  3.4* 1.7  --     Recent Results (from the past 240 hour(s))  Blood Culture (routine x 2)     Status: None (Preliminary result)   Collection Time: 12/17/2019  8:03 AM   Specimen: BLOOD  Result Value Ref Range Status   Specimen Description BLOOD LEFT ANTECUBITAL  Final   Special Requests   Final    BOTTLES DRAWN AEROBIC AND ANAEROBIC Blood Culture adequate volume   Culture   Final    NO GROWTH < 24 HOURS Performed at [facility], [address] [place], [place] [address]    Report Status PENDING  Incomplete  Blood Culture (routine x 2)     Status: None (Preliminary result)   Collection Time: 11/29/2019  8:07 AM   Specimen: BLOOD  Result Value Ref Range Status   Specimen Description BLOOD BLOOD LEFT FOREARM  Final   Special Requests   Final    BOTTLES DRAWN AEROBIC AND ANAEROBIC Blood Culture adequate volume   Culture   Final    NO GROWTH < 24 HOURS Performed at [facility], [address], [place], [place] [address]    Report Status PENDING  Incomplete  Resp Panel by RT PCR (RSV, Flu A&B, Covid) -     Status: None   Collection Time: 12/02/2019  8:52 AM  Result Value Ref Range Status   SARS Coronavirus 2 by RT PCR NEGATIVE NEGATIVE Final    Comment: (NOTE) SARS-CoV-2 target nucleic acids are NOT DETECTED.  The SARS-CoV-2  RNA is generally detectable in upper respiratoy specimens during the acute phase of infection. The lowest concentration of SARS-CoV-2 viral copies this assay can detect is 131 copies/mL. A negative result does not preclude SARS-Cov-2 infection and should not be used as the sole basis for treatment or other patient management decisions. A negative result may occur with  improper specimen collection/handling, submission of specimen other than nasopharyngeal swab, presence of viral mutation(s) within the areas targeted by this assay, and inadequate number of viral copies (<131 copies/mL). A negative result must be combined with clinical observations, patient history, and epidemiological  information. The expected result is Negative.  Fact Sheet for Patients:  https://www.moore.com/  Fact Sheet for Healthcare Providers:  https://www.young.biz/  This test is no t yet approved or cleared by the [place] FDA and  has been authorized for detection and/or diagnosis of SARS-CoV-2 by FDA under an Emergency Use Authorization (EUA). This EUA will remain  in effect (meaning this test can be used) for the duration of the COVID-19 declaration under Section 564(b)(1) of the Act, 21 U.S.C. section 360bbb-3(b)(1), unless the authorization is terminated or revoked sooner.     Influenza A by PCR NEGATIVE NEGATIVE Final   Influenza B by PCR NEGATIVE NEGATIVE Final    Comment: (NOTE) The Xpert Xpress SARS-CoV-2/FLU/RSV assay is intended as an aid in  the diagnosis of influenza from Nasopharyngeal swab specimens and  should not be used as a sole basis for treatment. Nasal washings and  aspirates are unacceptable for Xpert Xpress SARS-CoV-2/FLU/RSV  testing.  Fact Sheet for Patients: https://www.moore.com/  Fact Sheet for Healthcare Providers: https://www.young.biz/  This test is not yet approved or cleared by the  [place] FDA and  has been authorized for detection and/or diagnosis of SARS-CoV-2 by  FDA under an Emergency Use Authorization (EUA). This EUA will remain  in effect (meaning this test can be used) for the duration of the  Covid-19 declaration under Section 564(b)(1) of the Act, 21  U.S.C. section 360bbb-3(b)(1), unless the authorization is  terminated or revoked.    Respiratory Syncytial Virus by PCR NEGATIVE NEGATIVE Final    Comment: (NOTE) Fact Sheet for Patients: https://www.moore.com/  Fact Sheet for Healthcare Providers: https://www.young.biz/  This test is not yet approved or cleared by the [place] FDA and  has been authorized for detection and/or diagnosis of SARS-CoV-2 by  FDA under an Emergency Use Authorization (EUA). This EUA will remain  in effect (meaning this test can be used) for the duration of the  COVID-19 declaration under Section 564(b)(1) of the Act, 21 U.S.C.  section 360bbb-3(b)(1), unless the authorization is terminated or  revoked. Performed at [facility], [address] [place], [place] [address]   Respiratory Panel by RT PCR (Flu A&B, Covid) -     Status: Abnormal   Collection Time: 12/11/2019 12:55 PM  Result Value Ref Range Status   SARS Coronavirus 2 by RT PCR POSITIVE (A) NEGATIVE Final    Comment: RESULT CALLED TO, READ BACK BY AND VERIFIED WITH: DAVID, RACUEL AT 2009 ON 12/18/2019 BY SS (NOTE) SARS-CoV-2 target nucleic acids are DETECTED.  SARS-CoV-2 RNA is generally detectable in upper respiratory specimens  during the acute phase of infection. Positive results are indicative of the presence of the identified virus, but do not rule out bacterial infection or co-infection with other pathogens not detected by the test. Clinical correlation with patient history and other diagnostic information is necessary to determine patient infection status. The expected result is Negative.  Fact  Sheet for Patients:  https://www.moore.com/  Fact Sheet for Healthcare Providers: https://www.young.biz/  This test is not yet approved or cleared by the [place] FDA and  has been authorized for detection and/or diagnosis of SARS-CoV-2 by FDA under an Emergency Use Authorization (EUA).  This EUA will remain in effect (meaning this test ca n be used) for the duration of  the COVID-19 declaration under Section 564(b)(1) of the Act, 21 U.S.C. section 360bbb-3(b)(1), unless the authorization is terminated or revoked sooner.      Influenza A by PCR NEGATIVE NEGATIVE  Final   Influenza B by PCR NEGATIVE NEGATIVE Final    Comment: (NOTE) The Xpert Xpress SARS-CoV-2/FLU/RSV assay is intended as an aid in  the diagnosis of influenza from Nasopharyngeal swab specimens and  should not be used as a sole basis for treatment. Nasal washings and  aspirates are unacceptable for Xpert Xpress SARS-CoV-2/FLU/RSV  testing.  Fact Sheet for Patients: PinkCheek.be  Fact Sheet for Healthcare Providers: GravelBags.it  This test is not yet approved or cleared by the [place] FDA and  has been authorized for detection and/or diagnosis of SARS-CoV-2 by  FDA under an Emergency Use Authorization (EUA). This EUA will remain  in effect (meaning this test can be used) for the duration of the  Covid-19 declaration under Section 564(b)(1) of the Act, 21  U.S.C. section 360bbb-3(b)(1), unless the authorization is  terminated or revoked. Performed at [facility], [address] [place], [place] [address]          Radiology Studies: DG Chest 1 View  Result Date: 12/09/2019 CLINICAL DATA:  Altered mental status.  Fever. EXAM: CHEST  1 VIEW COMPARISON:  November 17, 2016. FINDINGS: Stable cardiomediastinal silhouette. No pneumothorax or pleural effusion is noted. Multiple airspace  opacities are noted throughout both lungs most consistent with multifocal pneumonia. Bony thorax is unremarkable. IMPRESSION: Bilateral multifocal pneumonia. Electronically Signed   By: Marijo Conception M.D.   On: 12/14/2019 08:19   CT Head Wo Contrast  Result Date: 12/21/2019 CLINICAL DATA:  Altered mental status EXAM: CT HEAD WITHOUT CONTRAST TECHNIQUE: Contiguous axial images were obtained from the base of the skull through the vertex without intravenous contrast. COMPARISON:  None. FINDINGS: Brain: Normal anatomic configuration. No abnormal intra or extra-axial mass lesion or fluid collection. No abnormal mass effect or midline shift. Remote lacunar infarct noted within the right basal ganglia. No evidence of acute intracranial hemorrhage or infarct. Ventricular size is normal. Cerebellum unremarkable. Vascular: No asymmetric hyperdense vasculature at the skull base. Moderate atherosclerotic calcification is noted within the carotid siphons bilaterally. Skull: Intact Sinuses/Orbits: Moderate mucosal thickening within the sphenoid sinuses. Mucous is noted within the visualized maxillary sinuses which is largely excluded from this examination. Remaining paranasal sinuses are clear. Orbits are unremarkable. Other: Mastoid air cells and middle ear cavities are clear. IMPRESSION: No evidence of acute intracranial hemorrhage or infarct. Remote right basal ganglia lacunar infarct. Mild paranasal sinus disease, incompletely assessed on this examination. Electronically Signed   By: Fidela Salisbury MD   On: 12/19/2019 11:53   CT Angio Chest PE W and/or Wo Contrast  Result Date: 12/13/2019 CLINICAL DATA:  Fever, cough, altered mental status EXAM: CT ANGIOGRAPHY CHEST WITH CONTRAST TECHNIQUE: Multidetector CT imaging of the chest was performed using the standard protocol during bolus administration of intravenous contrast. Multiplanar CT image reconstructions and MIPs were obtained to evaluate the vascular anatomy.  CONTRAST:  34mL OMNIPAQUE IOHEXOL 350 MG/ML SOLN COMPARISON:  None. FINDINGS: Cardiovascular: Satisfactory opacification of the pulmonary arteries to the segmental level. No evidence of pulmonary embolism. The central pulmonary arteries are of normal caliber. No significant coronary artery calcification. Normal heart size. No pericardial effusion. The thoracic aorta is unremarkable. Mediastinum/Nodes: Thyroid unremarkable. Esophagus unremarkable. There is shotty mediastinal lymphadenopathy within the right paratracheal, subcarinal, and prevascular lymph node groups, possibly reactive in nature. No pathologic adenopathy within the thorax. Lungs/Pleura: There is extensive bilateral predominantly mid and lower lung zone ground-glass pulmonary infiltrate demonstrating areas of "crazy paving" within the mid lung zones, and dense  consolidation within the posterior basal lower lobes bilaterally in keeping with acute atypical infection or inflammation. The findings are typical of that seen in acute to subacute COVID-19 pneumonia. There is extensive parenchymal involvement. No pneumothorax or pleural effusion. Central airways are widely patent. Upper Abdomen: No acute abnormality. Musculoskeletal: No chest wall abnormality. No acute or significant osseous findings. Review of the MIP images confirms the above findings. IMPRESSION: No pulmonary embolism. Extensive bilateral airspace infiltrate in keeping with acute infection or inflammation. The findings are typical of that seen with COVID-19 pneumonia. Extensive parenchymal involvement noted. Electronically Signed   By: Fidela Salisbury MD   On: 11/29/2019 11:59        Scheduled Meds: . enoxaparin (LOVENOX) injection  40 mg Subcutaneous Q24H  . insulin aspart  0-15 Units Subcutaneous TID WC  . methylPREDNISolone (SOLU-MEDROL) injection  60 mg Intravenous TID   Continuous Infusions: . sodium chloride 125 mL/hr at 12/16/19 0440  . azithromycin 500 mg (12/16/19 0813)   . cefTRIAXone (ROCEPHIN)  IV 2 g (12/16/19 1552)  . remdesivir 100 mg in NS 100 mL       LOS: 1 day    Time spent: 34 mins     Wyvonnia Dusky, MD [facility] Pager [phone]  If 7PM-7AM, please contact night-coverage [email or web address] 12/16/2019, 9:08 AM

## 2019-12-16 NOTE — Progress Notes (Signed)
Date: 12/16/2019,   MRN# [id number] Alexis Daugherty 11-06-1955 Code Status:     Code Status Orders  (From admission, onward)         Start     Ordered   12/25/2019 1132  Full code  Continuous        2019-12-25 1133        Code Status History    This patient has a current code status but no historical code status.   Advance Care Planning Activity     HPI: Patient more communicative today. COVID 19 repeat pcr test ispositive, spoke with ID. Marland Kitchen     PMHX:   Past Medical History:  Diagnosis Date  . Diabetes mellitus without complication [facility])    Surgical Hx:  Past Surgical History:  Procedure Laterality Date  . ABDOMINAL HYSTERECTOMY     Family Hx:  History reviewed. No pertinent family history. Social Hx:   Social History   Tobacco Use  . Smoking status: Never Smoker  Substance Use Topics  . Alcohol use: No  . Drug use: Not on file   Medication:    Home Medication:  Current Outpatient Rx  . Order #: [id number] Class: Historical Med  . Order #: [id number] Class: Historical Med  . Order #: [id number] Class: Historical Med  . Order #: [id number] Class: Historical Med    Current Medication: @CURMEDTAB @   Allergies:  Patient has no known allergies.  Review of Systems: Gen:  Denies  fever, sweats, chills HEENT: Denies blurred vision, double vision, ear pain, eye pain, hearing loss, nose bleeds, sore throat Cvc:  No dizziness, chest pain or heaviness Resp:   STILL very sob,  Gi: Denies swallowing difficulty, stomach pain, nausea or vomiting, diarrhea, constipation, bowel incontinence Gu:  Denies bladder incontinence, burning urine Ext:   No Joint pain, stiffness or swelling Skin: No skin rash, easy bruising or bleeding or hives Endoc:  No polyuria, polydipsia , polyphagia or weight change Psych: No depression, insomnia or hallucinations  Other:  All other systems negative  Physical Examination:   VS: BP 134/79   Pulse (!) 102   Temp 98.1 F (36.7 C) (Oral)   Resp (!) 22    Wt 56.4 kg   SpO2 92%   BMI 23.51 kg/m   General Appearance: severe distress, family at bed side. More communicative this pm  Neuro: without focal findings, mental status, speech normal, alert and oriented, cranial nerves 2-12 intact, reflexes normal and symmetric, sensation grossly normal  HEENT: PERRLA, EOM intact, no ptosis, no other lesions noticed Neck supple, no stridor Pulmonary:.No wheezing, No rales  Coarse breath sounds Cardiovascular:  tachy.  No m/r/g.  Abdominal aorta pulsation normal.    Abdomen:Benign, Soft, non-tender, No masses, hepatosplenomegaly, No lymphadenopathy Endoc: No evident thyromegaly, no signs of acromegaly or Cushing features Skin:   warm, no rashes, no ecchymosis  Extremities: normal, no cyanosis, clubbing, no edema, warm with normal capillary refill. Other findings:   Labs results:   Recent Labs    Dec 25, 2019 0803 12/16/19 0443  HGB 13.4 12.3  HCT 40.1 36.7  MCV 86.4 87.6  WBC 12.8* 14.1*  BUN 15 16  CREATININE 0.85 0.60  GLUCOSE 230* 229*  CALCIUM 8.6* 8.1*  INR 1.2 1.3*  ,  hiv - ve Inf a and b negative Lactic acid 1,59  Assessment and Plan:  This is a very sick lady, very hypoxic ar risk of ending up on the ventilator. Her chest ct showed practically a 5 lobe alveolar infiltrates,  Looks  more like covid pneumonia and severe hypoxia. May be a tad better than yesterday.  -solumedrol 60 mg tid -broad spectrum antibiotics as is -support on high flow/re breather, bipap, vent per response -if possible pronning -remdersivir per ID  I have personally obtained a history, examined the patient, evaluated laboratory and imaging results, formulated the assessment and plan and placed orders.  The Patient requires high complexity decision making for assessment and support, frequent evaluation and titration of therapies, application of advanced monitoring technologies and extensive interpretation of multiple databases.   Sayvon Arterberry,M.D. Pulmonary  & Critical care Medicine [facility]

## 2019-12-16 NOTE — Progress Notes (Signed)
Pt arrived to the floor not in stable condition o2 68% resp rate increased rapid called and pt put on bipap on transfer orders placed pt was extremely anxious ativan given per order, pt speaks  Alexis Daugherty at bedside, report given to ICU. md notified

## 2019-12-16 NOTE — ED Notes (Signed)
Pt oxygen saturation remains at 92% at this time. Pt respirations still labored. Temp checked by this RN. Temp at 98.1

## 2019-12-16 NOTE — Consult Note (Signed)
CRITICAL CARE NOTE  SYNOPSIS 64 year old lady, daughter at bed side, who gave the history. She mentioned the patient has been having difficulty breathing, fever, dry cough, poor intake for at least two weeks. She was given fluids, and tylenol. There is no hx of aspiration, recent travel, rashes, nodes, she is diabetic, no hx of connective disease. She is not spitting up bloody sputum. Covid PCR + She is unvaccinated. Sats 85 % on 15 liters oxygen. Non re-breather mask added.    10/19 progressive resp failure transferred to ICU 10/19 severe ARDS, failed biPAP, emergently intubated  CC  SEVERE  respiratory failure  HPI Severe critical illness High risk for cardiac arrest and death      Active Ambulatory Problems    Diagnosis Date Noted  . No Active Ambulatory Problems   Resolved Ambulatory Problems    Diagnosis Date Noted  . No Resolved Ambulatory Problems   Past Medical History:  Diagnosis Date  . Diabetes mellitus without complication [facility])    Past Surgical History:  Procedure Laterality Date  . ABDOMINAL HYSTERECTOMY       BP (!) 156/92 (BP Location: Right Arm)   Pulse (!) 116   Temp 98.1 F (36.7 C) (Oral)   Resp (!) 34   Wt 56.4 kg   SpO2 (!) 85%   BMI 23.51 kg/m    No intake/output data recorded. No intake/output data recorded.  SpO2: (!) 85 % O2 Flow Rate (L/min): 55 L/min FiO2 (%): 100 %  Estimated body mass index is 23.51 kg/m as calculated from the following:   Height as of 08/02/15: 5\' 1"  (1.549 m).   Weight as of this encounter: 56.4 kg.  SIGNIFICANT EVENTS   REVIEW OF SYSTEMS  PATIENT IS UNABLE TO PROVIDE COMPLETE REVIEW OF SYSTEMS DUE TO SEVERE CRITICAL ILLNESS      COVID-19 DISASTER DECLARATION:   FULL CONTACT PHYSICAL EXAMINATION WAS NOT POSSIBLE DUE TO TREATMENT OF COVID-19  AND CONSERVATION OF PERSONAL PROTECTIVE EQUIPMENT, LIMITED EXAM FINDINGS INCLUDE-   PHYSICAL EXAMINATION: PHYSICAL EXAMINATION:  GENERAL:critically ill  appearing, +resp distress HEAD: Normocephalic, atraumatic.  EYES: Pupils equal, round, reactive to light.  No scleral icterus.  MOUTH: Moist mucosal membrane. NECK: Supple. No thyromegaly. No nodules. No JVD.  PULMONARY: +rhonchi, +wheezing CARDIOVASCULAR: S1 and S2. Regular rate and rhythm. No murmurs, rubs, or gallops.  GASTROINTESTINAL: Soft, nontender, -distended. Positive bowel sounds.  MUSCULOSKELETAL: No swelling, clubbing, or edema.  NEUROLOGIC: obtunded SKIN:intact,warm,dry     Patient assessed or the symptoms described in the history of present illness.  In the context of the Global COVID-19 pandemic, which necessitated consideration that the patient might be at risk for infection with the SARS-CoV-2 virus that causes COVID-19, Institutional protocols and algorithms that pertain to the evaluation of patients at risk for COVID-19 are in a state of rapid change based on information released by regulatory bodies including the CDC and federal and state organizations. These policies and algorithms were followed during the patient's care while in hospital.    MEDICATIONS: I have reviewed all medications and confirmed regimen as documented   CULTURE RESULTS   Recent Results (from the past 240 hour(s))  Blood Culture (routine x 2)     Status: None (Preliminary result)   Collection Time: Dec 18, 2019  8:03 AM   Specimen: BLOOD  Result Value Ref Range Status   Specimen Description BLOOD LEFT ANTECUBITAL  Final   Special Requests   Final    BOTTLES DRAWN AEROBIC AND ANAEROBIC  Blood Culture adequate volume   Culture   Final    NO GROWTH < 24 HOURS Performed at [facility], [address] [place], [place] [address]    Report Status PENDING  Incomplete  Blood Culture (routine x 2)     Status: None (Preliminary result)   Collection Time: 07-30-19  8:07 AM   Specimen: BLOOD  Result Value Ref Range Status   Specimen Description BLOOD BLOOD LEFT FOREARM  Final   Special  Requests   Final    BOTTLES DRAWN AEROBIC AND ANAEROBIC Blood Culture adequate volume   Culture   Final    NO GROWTH < 24 HOURS Performed at [facility], [address] [place], [place] [address]    Report Status PENDING  Incomplete  Resp Panel by RT PCR (RSV, Flu A&B, Covid) -     Status: None   Collection Time: 07-30-19  8:52 AM  Result Value Ref Range Status   SARS Coronavirus 2 by RT PCR NEGATIVE NEGATIVE Final    Comment: (NOTE) SARS-CoV-2 target nucleic acids are NOT DETECTED.  The SARS-CoV-2 RNA is generally detectable in upper respiratoy specimens during the acute phase of infection. The lowest concentration of SARS-CoV-2 viral copies this assay can detect is 131 copies/mL. A negative result does not preclude SARS-Cov-2 infection and should not be used as the sole basis for treatment or other patient management decisions. A negative result may occur with  improper specimen collection/handling, submission of specimen other than nasopharyngeal swab, presence of viral mutation(s) within the areas targeted by this assay, and inadequate number of viral copies (<131 copies/mL). A negative result must be combined with clinical observations, patient history, and epidemiological information. The expected result is Negative.  Fact Sheet for Patients:  https://www.moore.com/https://www.fda.gov/media/142436/download  Fact Sheet for Healthcare Providers:  https://www.young.biz/https://www.fda.gov/media/142435/download  This test is no t yet approved or cleared by the [place] FDA and  has been authorized for detection and/or diagnosis of SARS-CoV-2 by FDA under an Emergency Use Authorization (EUA). This EUA will remain  in effect (meaning this test can be used) for the duration of the COVID-19 declaration under Section 564(b)(1) of the Act, 21 U.S.C. section 360bbb-3(b)(1), unless the authorization is terminated or revoked sooner.     Influenza A by PCR NEGATIVE NEGATIVE Final   Influenza B by PCR  NEGATIVE NEGATIVE Final    Comment: (NOTE) The Xpert Xpress SARS-CoV-2/FLU/RSV assay is intended as an aid in  the diagnosis of influenza from Nasopharyngeal swab specimens and  should not be used as a sole basis for treatment. Nasal washings and  aspirates are unacceptable for Xpert Xpress SARS-CoV-2/FLU/RSV  testing.  Fact Sheet for Patients: https://www.moore.com/https://www.fda.gov/media/142436/download  Fact Sheet for Healthcare Providers: https://www.young.biz/https://www.fda.gov/media/142435/download  This test is not yet approved or cleared by the [place] FDA and  has been authorized for detection and/or diagnosis of SARS-CoV-2 by  FDA under an Emergency Use Authorization (EUA). This EUA will remain  in effect (meaning this test can be used) for the duration of the  Covid-19 declaration under Section 564(b)(1) of the Act, 21  U.S.C. section 360bbb-3(b)(1), unless the authorization is  terminated or revoked.    Respiratory Syncytial Virus by PCR NEGATIVE NEGATIVE Final    Comment: (NOTE) Fact Sheet for Patients: https://www.moore.com/https://www.fda.gov/media/142436/download  Fact Sheet for Healthcare Providers: https://www.young.biz/https://www.fda.gov/media/142435/download  This test is not yet approved or cleared by the [place] FDA and  has been authorized for detection and/or diagnosis of SARS-CoV-2 by  FDA under  an Emergency Use Authorization (EUA). This EUA will remain  in effect (meaning this test can be used) for the duration of the  COVID-19 declaration under Section 564(b)(1) of the Act, 21 U.S.C.  section 360bbb-3(b)(1), unless the authorization is terminated or  revoked. Performed at [facility], [address] [place], [place] [address]   Respiratory Panel by RT PCR (Flu A&B, Covid) -     Status: Abnormal   Collection Time: 12/14/2019 12:55 PM  Result Value Ref Range Status   SARS Coronavirus 2 by RT PCR POSITIVE (A) NEGATIVE Final    Comment: RESULT CALLED TO, READ BACK BY AND VERIFIED WITH: DAVID, RACUEL AT 2009 ON  12/18/2019 BY SS (NOTE) SARS-CoV-2 target nucleic acids are DETECTED.  SARS-CoV-2 RNA is generally detectable in upper respiratory specimens  during the acute phase of infection. Positive results are indicative of the presence of the identified virus, but do not rule out bacterial infection or co-infection with other pathogens not detected by the test. Clinical correlation with patient history and other diagnostic information is necessary to determine patient infection status. The expected result is Negative.  Fact Sheet for Patients:  https://www.moore.com/  Fact Sheet for Healthcare Providers: https://www.young.biz/  This test is not yet approved or cleared by the [place] FDA and  has been authorized for detection and/or diagnosis of SARS-CoV-2 by FDA under an Emergency Use Authorization (EUA).  This EUA will remain in effect (meaning this test ca n be used) for the duration of  the COVID-19 declaration under Section 564(b)(1) of the Act, 21 U.S.C. section 360bbb-3(b)(1), unless the authorization is terminated or revoked sooner.      Influenza A by PCR NEGATIVE NEGATIVE Final   Influenza B by PCR NEGATIVE NEGATIVE Final    Comment: (NOTE) The Xpert Xpress SARS-CoV-2/FLU/RSV assay is intended as an aid in  the diagnosis of influenza from Nasopharyngeal swab specimens and  should not be used as a sole basis for treatment. Nasal washings and  aspirates are unacceptable for Xpert Xpress SARS-CoV-2/FLU/RSV  testing.  Fact Sheet for Patients: https://www.moore.com/  Fact Sheet for Healthcare Providers: https://www.young.biz/  This test is not yet approved or cleared by the [place] FDA and  has been authorized for detection and/or diagnosis of SARS-CoV-2 by  FDA under an Emergency Use Authorization (EUA). This EUA will remain  in effect (meaning this test can be used) for the duration of the   Covid-19 declaration under Section 564(b)(1) of the Act, 21  U.S.C. section 360bbb-3(b)(1), unless the authorization is  terminated or revoked. Performed at [facility], [address] [place], [place] [address]         FiO2 (%):  [100 %] 100 % CBC    Component Value Date/Time   WBC 14.1 (H) 12/16/2019 0443   RBC 4.19 12/16/2019 0443   HGB 12.3 12/16/2019 0443   HCT 36.7 12/16/2019 0443   PLT 361 12/16/2019 0443   MCV 87.6 12/16/2019 0443   MCH 29.4 12/16/2019 0443   MCHC 33.5 12/16/2019 0443   RDW 12.4 12/16/2019 0443   LYMPHSABS 1.5 12/07/2019 0803   MONOABS 0.7 12/04/2019 0803   EOSABS 0.0 11/29/2019 0803   BASOSABS 0.0 12/07/2019 0803   CMP Latest Ref Rng & Units 12/16/2019 12/04/2019  Glucose 70 - 99 mg/dL 756(E) 332(R)  BUN 8 - 23 mg/dL 16 15  Creatinine 5.18 - 1.00 mg/dL 8.41 6.60  Sodium 630 - 145 mmol/L 135 131(L)  Potassium 3.5 - 5.1  mmol/L 4.0 3.4(L)  Chloride 98 - 111 mmol/L 104 94(L)  CO2 22 - 32 mmol/L 18(L) 23  Calcium 8.9 - 10.3 mg/dL 8.1(L) 8.6(L)  Total Protein 6.5 - 8.1 g/dL - 7.8  Total Bilirubin 0.3 - 1.2 mg/dL - 1.6(H)  Alkaline Phos 38 - 126 U/L - 90  AST 15 - 41 U/L - 46(H)  ALT 0 - 44 U/L - 41       Indwelling Urinary Catheter continued, requirement due to   Reason to continue Indwelling Urinary Catheter strict Intake/Output monitoring for hemodynamic instability         Ventilator continued, requirement due to severe respiratory failure   Ventilator Sedation RASS 0 to -2      ASSESSMENT AND PLAN SYNOPSIS  Acute hypoxemic respiratory failure due to COVID-19 pneumonia / ARDS Mechanical ventilation via ARDS protocol, target PRVC 6 cc/kg Wean PEEP and FiO2 as able Goal plateau pressure less than 30, driving pressure less than 15 Paralytics if necessary for vent synchrony, gas exchange Cycle prone positioning if necessary for oxygenation Deep sedation per PAD protocol, goal RASS -4, currently fentanyl,  midazolam Diuresis as blood pressure and renal function can tolerate,  diuresis as tolerated based on Kidney function VAP prevention order set Remdesivir  IV STEROIDS   Severe ACUTE Hypoxic and Hypercapnic Respiratory Failure -continue Full MV support -continue Bronchodilator Therapy -Wean Fio2 and PEEP as tolerated -VAP/VENT bundle implementation      NEUROLOGY Acute toxic metabolic encephalopathy   CARDIAC ICU monitoring  ID -continue IV abx as prescibed -follow up cultures  GI GI PROPHYLAXIS as indicated   DIET-->NPO Constipation protocol as indicated  ENDO - will use ICU hypoglycemic\Hyperglycemia protocol if indicated     ELECTROLYTES -follow labs as needed -replace as needed -pharmacy consultation and following   DVT/GI PRX ordered and assessed TRANSFUSIONS AS NEEDED MONITOR FSBS I Assessed the need for Labs I Assessed the need for Foley I Assessed the need for Central Venous Line Family Discussion when available I Assessed the need for Mobilization I made an Assessment of medications to be adjusted accordingly Safety Risk assessment completed   CASE DISCUSSED IN MULTIDISCIPLINARY ROUNDS WITH ICU TEAM  Critical Care Time devoted to patient care services described in this note is 76 minutes.   Overall, patient is critically ill, prognosis is guarded.  Patient with Multiorgan failure and at high risk for cardiac arrest and death.    Lucie Leather, M.D.  [facility]  Medical Director [facility] Medical Director [facility] Cardio-Pulmonary Department

## 2019-12-16 NOTE — Progress Notes (Signed)
GOALS OF CARE DISCUSSION  The Clinical status was relayed to family in detail-daughter and son in law.  Updated and notified of patients medical condition.   Patient with increased WOB and using accessory muscles to breathe Explained to family course of therapy and the modalities     Patient with Progressive RESP  failure with probable very low chance of meaningful recovery despite all aggressive and optimal medical therapy. Patient is Suffering.  Family understands the situation.  Remains Full CODE  Family are satisfied with Plan of action and management. All questions answered  Additional CC time 32 mins   Elease Swarm Santiago Glad, M.D.  [facility]  Medical Director [facility] Medical Director [facility] Cardio-Pulmonary Department

## 2019-12-16 NOTE — Progress Notes (Signed)
Date of Admission:  12/22/2019       Subjective: Unable to talk because of severe sob and on NRB  Medications:  . enoxaparin (LOVENOX) injection  40 mg Subcutaneous Q24H  . insulin aspart  0-15 Units Subcutaneous TID WC  . methylPREDNISolone (SOLU-MEDROL) injection  60 mg Intravenous TID    Objective: Vital signs in last 24 hours: Temp:  [98.1 F (36.7 C)-101.7 F (38.7 C)] 98.1 F (36.7 C) (10/19 0739) Pulse Rate:  [83-128] 102 (10/19 1430) Resp:  [21-34] 22 (10/19 1430) BP: (109-166)/(69-98) 134/79 (10/19 1430) SpO2:  [77 %-98 %] 92 % (10/19 1430) FiO2 (%):  [100 %] 100 % (10/19 0650)  PHYSICAL EXAM:  General: Awake, has resp distress, tachypnec Lungs: b/l crepts Heart: Tachycardia Abdomen: Soft, Extremities: atraumatic, no cyanosis. No edema. No clubbing Skin: No rashes or lesions. Or bruising Lymph: Cervical, supraclavicular normal. Neurologic: Grossly non-focal  Lab Results Recent Labs    12/17/2019 0803 12/16/19 0443  WBC 12.8* 14.1*  HGB 13.4 12.3  HCT 40.1 36.7  NA 131* 135  K 3.4* 4.0  CL 94* 104  CO2 23 18*  BUN 15 16  CREATININE 0.85 0.60   Liver Panel Recent Labs    11/28/2019 0803  PROT 7.8  ALBUMIN 3.1*  AST 46*  ALT 41  ALKPHOS 90  BILITOT 1.6*   Sedimentation Rate No results for input(s): ESRSEDRATE in the last 72 hours. C-Reactive Protein No results for input(s): CRP in the last 72 hours.  Microbiology:  Studies/Results: DG Chest 1 View  Result Date: 11/30/2019 CLINICAL DATA:  Altered mental status.  Fever. EXAM: CHEST  1 VIEW COMPARISON:  November 17, 2016. FINDINGS: Stable cardiomediastinal silhouette. No pneumothorax or pleural effusion is noted. Multiple airspace opacities are noted throughout both lungs most consistent with multifocal pneumonia. Bony thorax is unremarkable. IMPRESSION: Bilateral multifocal pneumonia. Electronically Signed   By: Lupita Raider M.D.   On: 12/09/2019 08:19   CT Head Wo Contrast  Result  Date: 12/17/2019 CLINICAL DATA:  Altered mental status EXAM: CT HEAD WITHOUT CONTRAST TECHNIQUE: Contiguous axial images were obtained from the base of the skull through the vertex without intravenous contrast. COMPARISON:  None. FINDINGS: Brain: Normal anatomic configuration. No abnormal intra or extra-axial mass lesion or fluid collection. No abnormal mass effect or midline shift. Remote lacunar infarct noted within the right basal ganglia. No evidence of acute intracranial hemorrhage or infarct. Ventricular size is normal. Cerebellum unremarkable. Vascular: No asymmetric hyperdense vasculature at the skull base. Moderate atherosclerotic calcification is noted within the carotid siphons bilaterally. Skull: Intact Sinuses/Orbits: Moderate mucosal thickening within the sphenoid sinuses. Mucous is noted within the visualized maxillary sinuses which is largely excluded from this examination. Remaining paranasal sinuses are clear. Orbits are unremarkable. Other: Mastoid air cells and middle ear cavities are clear. IMPRESSION: No evidence of acute intracranial hemorrhage or infarct. Remote right basal ganglia lacunar infarct. Mild paranasal sinus disease, incompletely assessed on this examination. Electronically Signed   By: Helyn Numbers MD   On: 12/12/2019 11:53   CT Angio Chest PE W and/or Wo Contrast  Result Date: 12/21/2019 CLINICAL DATA:  Fever, cough, altered mental status EXAM: CT ANGIOGRAPHY CHEST WITH CONTRAST TECHNIQUE: Multidetector CT imaging of the chest was performed using the standard protocol during bolus administration of intravenous contrast. Multiplanar CT image reconstructions and MIPs were obtained to evaluate the vascular anatomy. CONTRAST:  6mL OMNIPAQUE IOHEXOL 350 MG/ML SOLN COMPARISON:  None. FINDINGS: Cardiovascular: Satisfactory opacification of the  pulmonary arteries to the segmental level. No evidence of pulmonary embolism. The central pulmonary arteries are of normal caliber. No  significant coronary artery calcification. Normal heart size. No pericardial effusion. The thoracic aorta is unremarkable. Mediastinum/Nodes: Thyroid unremarkable. Esophagus unremarkable. There is shotty mediastinal lymphadenopathy within the right paratracheal, subcarinal, and prevascular lymph node groups, possibly reactive in nature. No pathologic adenopathy within the thorax. Lungs/Pleura: There is extensive bilateral predominantly mid and lower lung zone ground-glass pulmonary infiltrate demonstrating areas of "crazy paving" within the mid lung zones, and dense consolidation within the posterior basal lower lobes bilaterally in keeping with acute atypical infection or inflammation. The findings are typical of that seen in acute to subacute COVID-19 pneumonia. There is extensive parenchymal involvement. No pneumothorax or pleural effusion. Central airways are widely patent. Upper Abdomen: No acute abnormality. Musculoskeletal: No chest wall abnormality. No acute or significant osseous findings. Review of the MIP images confirms the above findings. IMPRESSION: No pulmonary embolism. Extensive bilateral airspace infiltrate in keeping with acute infection or inflammation. The findings are typical of that seen with COVID-19 pneumonia. Extensive parenchymal involvement noted. Electronically Signed   By: Helyn Numbers MD   On: 12/14/2019 11:59     Assessment/Plan: Acute hypoxic respiratory failure with bilateral infiltrates covering the entire lungs due to SARS COv 2 infection .  Initial test was neg but 2nd test positive- so could be a collection error Started Remdisivir last night- also on steroids- would   She has been treated for latent TB but this does not look like tuberculosis   Recommend keeping patient in airborne isolation Repeat SARS-CoV-2 test has been sent. Patient has been seen by pulmonologist.  Chronic hepatitis B infection.  Low-level viral load.   watch for flare secondary to  immune suppressive therapy with high dose steroids  Diabetes mellitus management as per primary team  Discussed the management with the care team

## 2019-12-16 NOTE — Progress Notes (Signed)
RT responded to RRT page for pt desat into 70's. Pt in noticable resp distress. Pt placed on BiPAP 12/8 100% RR 8. Pt RR in high 50's with sat increased in mid 90's. Pt unable to recover from move to room from ER. Pt transferred to ICU for increased level of care

## 2019-12-16 NOTE — ED Notes (Signed)
Pt proned to the right. Pt on heated high flow at max with O2 at 93%.

## 2019-12-17 ENCOUNTER — Inpatient Hospital Stay: Payer: Medicare Other

## 2019-12-17 DIAGNOSIS — E876 Hypokalemia: Secondary | ICD-10-CM | POA: Diagnosis not present

## 2019-12-17 DIAGNOSIS — A4189 Other specified sepsis: Secondary | ICD-10-CM | POA: Diagnosis not present

## 2019-12-17 DIAGNOSIS — U071 COVID-19: Secondary | ICD-10-CM | POA: Diagnosis not present

## 2019-12-17 DIAGNOSIS — I959 Hypotension, unspecified: Secondary | ICD-10-CM | POA: Diagnosis not present

## 2019-12-17 DIAGNOSIS — E872 Acidosis, unspecified: Secondary | ICD-10-CM

## 2019-12-17 DIAGNOSIS — E0800 Diabetes mellitus due to underlying condition with hyperosmolarity without nonketotic hyperglycemic-hyperosmolar coma (NKHHC): Secondary | ICD-10-CM | POA: Diagnosis not present

## 2019-12-17 DIAGNOSIS — J9601 Acute respiratory failure with hypoxia: Secondary | ICD-10-CM | POA: Diagnosis not present

## 2019-12-17 LAB — GLUCOSE, CAPILLARY
Glucose-Capillary: 349 mg/dL — ABNORMAL HIGH (ref 70–99)
Glucose-Capillary: 311 mg/dL — ABNORMAL HIGH (ref 70–99)
Glucose-Capillary: 238 mg/dL — ABNORMAL HIGH (ref 70–99)
Glucose-Capillary: 158 mg/dL — ABNORMAL HIGH (ref 70–99)
Glucose-Capillary: 210 mg/dL — ABNORMAL HIGH (ref 70–99)
Glucose-Capillary: 225 mg/dL — ABNORMAL HIGH (ref 70–99)
Glucose-Capillary: 248 mg/dL — ABNORMAL HIGH (ref 70–99)

## 2019-12-17 LAB — COMPREHENSIVE METABOLIC PANEL
ALT: 30 U/L (ref 0–44)
AST: 26 U/L (ref 15–41)
Albumin: 2.4 g/dL — ABNORMAL LOW (ref 3.5–5.0)
Alkaline Phosphatase: 109 U/L (ref 38–126)
Anion gap: 11 (ref 5–15)
BUN: 34 mg/dL — ABNORMAL HIGH (ref 8–23)
CO2: 20 mmol/L — ABNORMAL LOW (ref 22–32)
Calcium: 8.8 mg/dL — ABNORMAL LOW (ref 8.9–10.3)
Chloride: 105 mmol/L (ref 98–111)
Creatinine, Ser: 0.86 mg/dL (ref 0.44–1.00)
GFR, Estimated: >60 mL/min (ref >60)
Glucose, Bld: 309 mg/dL — ABNORMAL HIGH (ref 70–99)
Potassium: 4 mmol/L (ref 3.5–5.1)
Sodium: 136 mmol/L (ref 135–145)
Total Bilirubin: 1.1 mg/dL (ref 0.3–1.2)
Total Protein: 6.5 g/dL (ref 6.5–8.1)

## 2019-12-17 LAB — RESPIRATORY PANEL BY RT PCR (FLU A&B, COVID)
Influenza A by PCR: NEGATIVE
Influenza B by PCR: NEGATIVE
SARS Coronavirus 2 by RT PCR: POSITIVE — AB

## 2019-12-17 LAB — TRIGLYCERIDES: Triglycerides: 163 mg/dL — ABNORMAL HIGH (ref <150)

## 2019-12-17 LAB — BLOOD GAS, ARTERIAL
Acid-base deficit: 8.7 mmol/L — ABNORMAL HIGH (ref 0.0–2.0)
Bicarbonate: 18.4 mmol/L — ABNORMAL LOW (ref 20.0–28.0)
FIO2: 0.8
O2 Saturation: 99.7 %
PEEP: 10 cmH2O
Patient temperature: 37
RATE: 24 resp/min
pCO2 arterial: 43 mmHg (ref 32.0–48.0)
pH, Arterial: 7.24 — ABNORMAL LOW (ref 7.350–7.450)
pO2, Arterial: 231 mmHg — ABNORMAL HIGH (ref 83.0–108.0)

## 2019-12-17 LAB — URINE CULTURE: Culture: NO GROWTH

## 2019-12-17 LAB — PROCALCITONIN: Procalcitonin: 2.56 ng/mL

## 2019-12-17 LAB — CBC
HCT: 37.1 % (ref 36.0–46.0)
Hemoglobin: 12.2 g/dL (ref 12.0–15.0)
MCH: 29 pg (ref 26.0–34.0)
MCHC: 32.9 g/dL (ref 30.0–36.0)
MCV: 88.1 fL (ref 80.0–100.0)
Platelets: 266 10*3/uL (ref 150–400)
RBC: 4.21 MIL/uL (ref 3.87–5.11)
RDW: 13 % (ref 11.5–15.5)
WBC: 23.2 10*3/uL — ABNORMAL HIGH (ref 4.0–10.5)
nRBC: 0 % (ref 0.0–0.2)

## 2019-12-17 LAB — RHEUMATOID FACTOR: Rheumatoid fact SerPl-aCnc: 120.1 IU/mL — ABNORMAL HIGH (ref 0.0–13.9)

## 2019-12-17 LAB — ANA W/REFLEX: Anti Nuclear Antibody (ANA): NEGATIVE

## 2019-12-17 LAB — C-REACTIVE PROTEIN: CRP: 22.5 mg/dL — ABNORMAL HIGH (ref <1.0)

## 2019-12-17 LAB — MAGNESIUM: Magnesium: 2.6 mg/dL — ABNORMAL HIGH (ref 1.7–2.4)

## 2019-12-17 LAB — FIBRIN DERIVATIVES D-DIMER (ARMC ONLY): Fibrin derivatives D-dimer (ARMC): >7500 ng/mL (FEU) — ABNORMAL HIGH (ref 0.00–499.00)

## 2019-12-17 LAB — C3 COMPLEMENT: C3 Complement: 144 mg/dL (ref 82–167)

## 2019-12-17 LAB — LEGIONELLA PNEUMOPHILA SEROGP 1 UR AG: L. pneumophila Serogp 1 Ur Ag: NEGATIVE

## 2019-12-17 LAB — C4 COMPLEMENT: Complement C4, Body Fluid: 21 mg/dL (ref 12–38)

## 2019-12-17 LAB — FERRITIN: Ferritin: 545 ng/mL — ABNORMAL HIGH (ref 11–307)

## 2019-12-17 LAB — LACTATE DEHYDROGENASE: LDH: 469 U/L — ABNORMAL HIGH (ref 98–192)

## 2019-12-17 LAB — MPO/PR-3 (ANCA) ANTIBODIES
ANCA Proteinase 3: <3.5 U/mL (ref 0.0–3.5)
Myeloperoxidase Abs: <9 U/mL (ref 0.0–9.0)

## 2019-12-17 LAB — PHOSPHORUS: Phosphorus: 3.3 mg/dL (ref 2.5–4.6)

## 2019-12-17 MED ORDER — VITAL 1.5 CAL PO LIQD: 1000.0000 mL | ORAL | Status: DC

## 2019-12-17 MED ORDER — VECURONIUM BROMIDE 10 MG IV SOLR
INTRAVENOUS | Status: AC
  Administered 2019-12-17: 10 mg via INTRAVENOUS
  Filled 2019-12-17: qty 10

## 2019-12-17 MED ORDER — ADULT MULTIVITAMIN W/MINERALS CH
1.0000 | ORAL_TABLET | Freq: Every day | ORAL | Status: DC
  Administered 2019-12-18 – 2019-12-19 (×2): 1
  Filled 2019-12-17 (×2): qty 1

## 2019-12-17 MED ORDER — VECURONIUM BROMIDE 10 MG IV SOLR
10.0000 mg | INTRAVENOUS | Status: DC | PRN
  Administered 2019-12-17 – 2019-12-19 (×6): 10 mg via INTRAVENOUS
  Filled 2019-12-17 (×8): qty 10

## 2019-12-17 MED ORDER — VITAL HIGH PROTEIN PO LIQD: 1000.0000 mL | ORAL | Status: DC

## 2019-12-17 MED ORDER — NOREPINEPHRINE 4 MG/250ML-% IV SOLN
0.0000 ug/min | INTRAVENOUS | Status: DC
  Administered 2019-12-17: 3 ug/min via INTRAVENOUS
  Administered 2019-12-17: 5 ug/min via INTRAVENOUS
  Administered 2019-12-18: 3 ug/min via INTRAVENOUS
  Administered 2019-12-18 – 2019-12-19 (×2): 10 ug/min via INTRAVENOUS
  Filled 2019-12-17 (×6): qty 250

## 2019-12-17 MED ORDER — PANTOPRAZOLE SODIUM 40 MG IV SOLR
40.0000 mg | INTRAVENOUS | Status: DC
  Administered 2019-12-17 – 2019-12-19 (×3): 40 mg via INTRAVENOUS
  Filled 2019-12-17 (×3): qty 40

## 2019-12-17 MED ORDER — VITAL AF 1.2 CAL PO LIQD
1000.0000 mL | ORAL | Status: DC
  Administered 2019-12-17: 1000 mL

## 2019-12-17 MED ORDER — INSULIN ASPART 100 UNIT/ML ~~LOC~~ SOLN: 0.0000 [IU] | SUBCUTANEOUS | Status: DC

## 2019-12-17 MED ORDER — PROSOURCE TF PO LIQD
90.0000 mL | Freq: Two times a day (BID) | ORAL | Status: DC
  Administered 2019-12-17 – 2019-12-18 (×2): 90 mL
  Filled 2019-12-17: qty 90

## 2019-12-17 MED ORDER — INSULIN ASPART 100 UNIT/ML ~~LOC~~ SOLN
15.0000 [IU] | Freq: Once | SUBCUTANEOUS | Status: AC
  Administered 2019-12-17: 15 [IU] via SUBCUTANEOUS

## 2019-12-17 MED ORDER — INSULIN ASPART 100 UNIT/ML ~~LOC~~ SOLN
0.0000 [IU] | SUBCUTANEOUS | Status: DC
  Administered 2019-12-17: 4 [IU] via SUBCUTANEOUS
  Administered 2019-12-17: 7 [IU] via SUBCUTANEOUS
  Administered 2019-12-17: 20 [IU] via SUBCUTANEOUS
  Administered 2019-12-17: 7 [IU] via SUBCUTANEOUS
  Administered 2019-12-17: 4 [IU] via SUBCUTANEOUS
  Administered 2019-12-17: 7 [IU] via SUBCUTANEOUS
  Administered 2019-12-18: 11 [IU] via SUBCUTANEOUS
  Administered 2019-12-18 (×2): 4 [IU] via SUBCUTANEOUS
  Administered 2019-12-18: 11 [IU] via SUBCUTANEOUS
  Administered 2019-12-18: 4 [IU] via SUBCUTANEOUS
  Administered 2019-12-19: 7 [IU] via SUBCUTANEOUS
  Administered 2019-12-19 (×3): 11 [IU] via SUBCUTANEOUS
  Administered 2019-12-19: 15 [IU] via SUBCUTANEOUS
  Administered 2019-12-19: 11 [IU] via SUBCUTANEOUS
  Administered 2019-12-20: 7 [IU] via SUBCUTANEOUS
  Filled 2019-12-17 (×19): qty 1

## 2019-12-17 NOTE — Consult Note (Signed)
CRITICAL CARE NOTE  Brief HPI:  64yo female admitted 10/18 after being sick for 2 weeks w/difficulty breathing, fever, dry cough and poor intake and found to have COVID Pneumonia. Patient is unvaccinated. COVID PCR +10/18. Intubated 10/19.   10/19 progressive resp failure transferred to ICU and emergently intubated 10/20 Received Vec x1 overnight, weaning FIO2, GI prophy added, Tube feeds started, Increase insulin coverage, steroids discontinued  CC  Acute respiratory failure due to COVID Pneumonia  Subjective: tmax 98.1 IOs: -431 Vec x1 overnight, no other acute events   Active Ambulatory Problems    Diagnosis Date Noted   No Active Ambulatory Problems   Resolved Ambulatory Problems    Diagnosis Date Noted   No Resolved Ambulatory Problems   Past Medical History:  Diagnosis Date   Diabetes mellitus without complication (HCC)    Past Surgical History:  Procedure Laterality Date   ABDOMINAL HYSTERECTOMY       BP (!) 88/62    Pulse 82    Temp (!) 95.8 F (35.4 C) (Axillary)    Resp 19    Wt 56.4 kg    SpO2 100%    BMI 23.51 kg/m    I/O last 3 completed shifts: In: 168.4 [I.V.:168.4] Out: 600 [Urine:600] No intake/output data recorded.  SpO2: 100 % O2 Flow Rate (L/min): 55 L/min FiO2 (%): 40 %  Estimated body mass index is 23.51 kg/m as calculated from the following:   Height as of 08/02/15: 5\' 1"  (1.549 m).   Weight as of this encounter: 56.4 kg.   REVIEW OF SYSTEMS Unable to assess due to clinical condition    PHYSICAL EXAMINATION:  GENERAL:critically ill appearing, +resp distress HEAD: Normocephalic, atraumatic.  EYES: Pupils equal, round, reactive to light.  No scleral icterus.  MOUTH: Moist mucosal membrane. NECK: Supple. No thyromegaly. No nodules. No JVD.  PULMONARY: +rhonchi, +wheezing CARDIOVASCULAR: S1 and S2. Regular rate and rhythm. No murmurs, rubs, or gallops.  GASTROINTESTINAL: Soft, nontender, -distended. Positive bowel sounds.   MUSCULOSKELETAL: No swelling, clubbing, or edema.  NEUROLOGIC: obtunded SKIN:intact,warm,dry   MEDICATIONS: I have reviewed all medications and confirmed regimen as documented   CULTURE RESULTS   Recent Results (from the past 240 hour(s))  Blood Culture (routine x 2)     Status: None (Preliminary result)   Collection Time: 2020-01-13  8:03 AM   Specimen: BLOOD  Result Value Ref Range Status   Specimen Description BLOOD LEFT ANTECUBITAL  Final   Special Requests   Final    BOTTLES DRAWN AEROBIC AND ANAEROBIC Blood Culture adequate volume   Culture   Final    NO GROWTH 2 DAYS Performed at [facility], [address], [place], [place]    Report Status PENDING  Incomplete  Urine culture     Status: None   Collection Time: 13-Jan-2020  8:03 AM   Specimen: In/Out Cath Urine  Result Value Ref Range Status   Specimen Description   Final    IN/OUT CATH URINE Performed at [facility], [address] [place], [place]    Special Requests   Final    NONE Performed at [facility], [address], [place], [place]    Culture   Final    NO GROWTH Performed at [facility], [address] [place], [place]    Report Status 12/17/2019 FINAL  Final  Blood Culture (routine x 2)     Status: None (Preliminary result)  Collection Time: 01-07-2020  8:07 AM   Specimen: BLOOD  Result Value Ref Range Status   Specimen Description BLOOD BLOOD LEFT FOREARM  Final   Special Requests   Final    BOTTLES DRAWN AEROBIC AND ANAEROBIC Blood Culture adequate volume   Culture   Final    NO GROWTH 2 DAYS Performed at [facility], [address], [place], [place] [address]    Report Status PENDING  Incomplete  Resp Panel by RT PCR (RSV, Flu A&B, Covid) -     Status: None   Collection Time: 07-Jan-2020  8:52 AM  Result Value Ref Range Status   SARS Coronavirus 2 by RT PCR NEGATIVE NEGATIVE Final    Comment:  (NOTE) SARS-CoV-2 target nucleic acids are NOT DETECTED.  The SARS-CoV-2 RNA is generally detectable in upper respiratoy specimens during the acute phase of infection. The lowest concentration of SARS-CoV-2 viral copies this assay can detect is 131 copies/mL. A negative result does not preclude SARS-Cov-2 infection and should not be used as the sole basis for treatment or other patient management decisions. A negative result may occur with  improper specimen collection/handling, submission of specimen other than nasopharyngeal swab, presence of viral mutation(s) within the areas targeted by this assay, and inadequate number of viral copies (<131 copies/mL). A negative result must be combined with clinical observations, patient history, and epidemiological information. The expected result is Negative.  Fact Sheet for Patients:  https://www.moore.com/  Fact Sheet for Healthcare Providers:  https://www.young.biz/  This test is no t yet approved or cleared by the [place] FDA and  has been authorized for detection and/or diagnosis of SARS-CoV-2 by FDA under an Emergency Use Authorization (EUA). This EUA will remain  in effect (meaning this test can be used) for the duration of the COVID-19 declaration under Section 564(b)(1) of the Act, 21 U.S.C. section 360bbb-3(b)(1), unless the authorization is terminated or revoked sooner.     Influenza A by PCR NEGATIVE NEGATIVE Final   Influenza B by PCR NEGATIVE NEGATIVE Final    Comment: (NOTE) The Xpert Xpress SARS-CoV-2/FLU/RSV assay is intended as an aid in  the diagnosis of influenza from Nasopharyngeal swab specimens and  should not be used as a sole basis for treatment. Nasal washings and  aspirates are unacceptable for Xpert Xpress SARS-CoV-2/FLU/RSV  testing.  Fact Sheet for Patients: https://www.moore.com/  Fact Sheet for Healthcare  Providers: https://www.young.biz/  This test is not yet approved or cleared by the [place] FDA and  has been authorized for detection and/or diagnosis of SARS-CoV-2 by  FDA under an Emergency Use Authorization (EUA). This EUA will remain  in effect (meaning this test can be used) for the duration of the  Covid-19 declaration under Section 564(b)(1) of the Act, 21  U.S.C. section 360bbb-3(b)(1), unless the authorization is  terminated or revoked.    Respiratory Syncytial Virus by PCR NEGATIVE NEGATIVE Final    Comment: (NOTE) Fact Sheet for Patients: https://www.moore.com/  Fact Sheet for Healthcare Providers: https://www.young.biz/  This test is not yet approved or cleared by the [place] FDA and  has been authorized for detection and/or diagnosis of SARS-CoV-2 by  FDA under an Emergency Use Authorization (EUA). This EUA will remain  in effect (meaning this test can be used) for the duration of the  COVID-19 declaration under Section 564(b)(1) of the Act, 21 U.S.C.  section 360bbb-3(b)(1), unless the authorization is terminated or  revoked. Performed at [facility], [address] [place],  [place] [address]   Respiratory Panel by RT PCR (Flu A&B, Covid) -     Status: Abnormal   Collection Time: January 03, 2020 12:55 PM  Result Value Ref Range Status   SARS Coronavirus 2 by RT PCR POSITIVE (A) NEGATIVE Final    Comment: RESULT CALLED TO, READ BACK BY AND VERIFIED WITH: DAVID, RACUEL AT 2009 ON Jan 03, 2020 BY SS (NOTE) SARS-CoV-2 target nucleic acids are DETECTED.  SARS-CoV-2 RNA is generally detectable in upper respiratory specimens  during the acute phase of infection. Positive results are indicative of the presence of the identified virus, but do not rule out bacterial infection or co-infection with other pathogens not detected by the test. Clinical correlation with patient history and other diagnostic  information is necessary to determine patient infection status. The expected result is Negative.  Fact Sheet for Patients:  https://www.moore.com/  Fact Sheet for Healthcare Providers: https://www.young.biz/  This test is not yet approved or cleared by the [place] FDA and  has been authorized for detection and/or diagnosis of SARS-CoV-2 by FDA under an Emergency Use Authorization (EUA).  This EUA will remain in effect (meaning this test ca n be used) for the duration of  the COVID-19 declaration under Section 564(b)(1) of the Act, 21 U.S.C. section 360bbb-3(b)(1), unless the authorization is terminated or revoked sooner.      Influenza A by PCR NEGATIVE NEGATIVE Final   Influenza B by PCR NEGATIVE NEGATIVE Final    Comment: (NOTE) The Xpert Xpress SARS-CoV-2/FLU/RSV assay is intended as an aid in  the diagnosis of influenza from Nasopharyngeal swab specimens and  should not be used as a sole basis for treatment. Nasal washings and  aspirates are unacceptable for Xpert Xpress SARS-CoV-2/FLU/RSV  testing.  Fact Sheet for Patients: https://www.moore.com/  Fact Sheet for Healthcare Providers: https://www.young.biz/  This test is not yet approved or cleared by the [place] FDA and  has been authorized for detection and/or diagnosis of SARS-CoV-2 by  FDA under an Emergency Use Authorization (EUA). This EUA will remain  in effect (meaning this test can be used) for the duration of the  Covid-19 declaration under Section 564(b)(1) of the Act, 21  U.S.C. section 360bbb-3(b)(1), unless the authorization is  terminated or revoked. Performed at [facility], [address] [place], [place] [address]   MRSA PCR Screening     Status: None   Collection Time: 12/16/19  8:41 PM   Specimen: Nasopharyngeal  Result Value Ref Range Status   MRSA by PCR NEGATIVE NEGATIVE Final    Comment:         The GeneXpert MRSA Assay (FDA approved for NASAL specimens only), is one component of a comprehensive MRSA colonization surveillance program. It is not intended to diagnose MRSA infection nor to guide or monitor treatment for MRSA infections. Performed at [facility], [address] [place], [place] [address]         Vent Mode: PRVC FiO2 (%):  [40 %-100 %] 40 % Set Rate:  [20 bmp-24 bmp] 24 bmp Vt Set:  [420 mL] 420 mL PEEP:  [10 cmH20] 10 cmH20 Plateau Pressure:  [25 cmH20-28 cmH20] 25 cmH20 CBC    Component Value Date/Time   WBC 23.2 (H) 12/17/2019 0504   RBC 4.21 12/17/2019 0504   HGB 12.2 12/17/2019 0504   HCT 37.1 12/17/2019 0504   PLT 266 12/17/2019 0504   MCV 88.1 12/17/2019 0504   MCH 29.0 12/17/2019 0504   MCHC 32.9 12/17/2019 0504  RDW 13.0 12/17/2019 0504   LYMPHSABS 1.5 2020-02-13 0803   MONOABS 0.7 2020-02-13 0803   EOSABS 0.0 2020-02-13 0803   BASOSABS 0.0 2020-02-13 0803   CMP Latest Ref Rng & Units 12/17/2019 12/16/2019 2020-02-13  Glucose 70 - 99 mg/dL 161(W309(H) 960(A229(H) 540(J230(H)  BUN 8 - 23 mg/dL 81(X34(H) 16 15  Creatinine 0.44 - 1.00 mg/dL 9.140.86 7.820.60 9.560.85  Sodium 135 - 145 mmol/L 136 135 131(L)  Potassium 3.5 - 5.1 mmol/L 4.0 4.0 3.4(L)  Chloride 98 - 111 mmol/L 105 104 94(L)  CO2 22 - 32 mmol/L 20(L) 18(L) 23  Calcium 8.9 - 10.3 mg/dL 2.1(H8.8(L) 8.1(L) 8.6(L)  Total Protein 6.5 - 8.1 g/dL 6.5 - 7.8  Total Bilirubin 0.3 - 1.2 mg/dL 1.1 - 1.6(H)  Alkaline Phos 38 - 126 U/L 109 - 90  AST 15 - 41 U/L 26 - 46(H)  ALT 0 - 44 U/L 30 - 41       Indwelling Urinary Catheter continued, requirement due to   Reason to continue Indwelling Urinary Catheter strict Intake/Output monitoring for hemodynamic instability         Ventilator continued, requirement due to severe respiratory failure   Ventilator Sedation RASS 0 to -2      ASSESSMENT AND PLAN   Acute hypoxemic respiratory failure due to COVID-19 pneumonia / ARDS --Mechanical  ventilation via ARDS protocol, target PRVC 6 cc/kg --Wean PEEP and FiO2 as able --Goal plateau pressure less than 30, driving pressure less than 15 --Paralytics if necessary for vent synchrony, gas exchange --Cycle prone positioning if necessary for oxygenation --Deep sedation per PAD protocol, goal RASS -4, currently fentanyl, midazolam --Hold diuresis today as patient is requiring low dose Levophed and overall improving from oxygenation standpoint --Remdesivir  --Steroids completed --Continue Ceftriaxone  Acute toxic metabolic encephalopathy --2/2 above  Stress induced Hyperglycemia --Continue SSI --Add long acting insulin  Prophlaxis: --GI: Protonix --DVT: Lovenox --Bowel: Colace  FEN: --No MIVFs --Replace as needed --Add Enteral feeds today  Family Engagement: I called and spoke w/the patients son and daughter today. They were concerned regarding the negative and then positive covid test. They requested a repeat test which has been ordered.  CODE Status: Full Dispo: To remain in ICU   Amanda CockayneStephanie Labrea Eccleston ACNP-BC

## 2019-12-17 NOTE — Progress Notes (Signed)
Initial Nutrition Assessment  RD working remotely.  DOCUMENTATION CODES:   Not applicable  INTERVENTION:  Initiate Vital AF 1.2 Cal at 25 mL/hr (600 mL goal daily volume) + PROSource TF 90 mL BID per tube. Provides 880 kcal, 89 grams of protein, 486 mL H2O daily. With current propofol rate provides 1148 kcal daily.  Provide MVI daily per tube.  Monitor magnesium, potassium, and phosphorus daily for at least 3 days, MD to replete as needed, as pt is at risk for refeeding syndrome.  NUTRITION DIAGNOSIS:   Inadequate oral intake related to inability to eat as evidenced by NPO status.  GOAL:   Patient will meet greater than or equal to 90% of their needs  MONITOR:   Vent status, Labs, Weight trends, TF tolerance, Skin, I & O's  REASON FOR ASSESSMENT:   Ventilator    ASSESSMENT:   64 year old female with PMHx of DM admitted with COVID-19 PNA.   10/19 transferred to ICU and intubated  Per review of chart patient had poor PO intake for 2 weeks PTA.  Patient is currently intubated on ventilator support MV: 10.1 L/min Temp (24hrs), Avg:97.3 F (36.3 C), Min:95.8 F (35.4 C), Max:98.6 F (37 C)  Propofol: 10.15 ml/hr (268 kcal daily)  Medications reviewed and include: Colace 100 mg BID, Novolog 0-20 units Q4hrs, Protonix, Miralax, azithromycin, ceftriaxone, fentanyl gtt, norepinephrine gtt at 4 mcg/min, propofol gtt, remdesivir.  Labs reviewed: CBG 238, CO2 20, BUN 34, Magnesium 2.6.  I/O: 600 mL UOP Yesterday (0.4 mL/kg/hr)  Weight trend: limited wt hx in chart to trend; pt was 59 kg on 08/02/2015; currently documented to be 56.4 kg (124.4 lbs)  Enteral Access: 16 Fr. OGT placed 10/19; terminates in stomach per abdominal x-ray 10/19  Discussed with MD via secure chat. Plan is to start tube feeds today.  NUTRITION - FOCUSED PHYSICAL EXAM:  Unable to complete at this time as RD is working remotely.  Diet Order:   Diet Order    None     EDUCATION NEEDS:   Not  appropriate for education at this time  Skin:  Skin Assessment: Reviewed RN Assessment  Last BM:  pta  Height:   Ht Readings from Last 1 Encounters:  12/17/19 5\' 1"  (1.549 m)   Weight:   Wt Readings from Last 1 Encounters:  2020/01/12 56.4 kg   Ideal Body Weight:  47.7 kg  BMI:  Body mass index is 23.51 kg/m.  Estimated Nutritional Needs:   Kcal:  1127kcal/day  Protein:  85-95 grams  Fluid:  1.4-1.6L/day  12/17/19, MS, RD, LDN Pager number available on Amion

## 2019-12-17 NOTE — Progress Notes (Signed)
Spoke to family and they are concerned about positive status of Covid test being valid as 1st test was negative.  Concerned about her getting Remdesivir if she is not positive for Covid. Notified team and okay to retest and see if patient is positive before giving Remdesivir.  Notified team patient's family would like to speak to the MD on her case.

## 2019-12-17 NOTE — Progress Notes (Signed)
ID Patient was intubated last night Repeat SARS-CoV-2 test x2 is positive even though the first 1 was negative. Patient Vitals for the past 24 hrs:  BP Temp Temp src Pulse Resp SpO2 Height  12/17/19 1900 (!) 92/58 -- -- (!) 109 (!) 24 93 % --  12/17/19 1800 (!) 81/60 -- -- (!) 112 (!) 24 92 % --  12/17/19 1700 94/61 -- -- (!) 113 (!) 24 91 % --  12/17/19 1636 -- -- -- -- -- 95 % --  12/17/19 1604 -- -- -- 100 (!) 24 96 % --  12/17/19 1600 98/64 98.2 F (36.8 C) Axillary 98 14 (!) 87 % --  12/17/19 1555 -- -- -- 92 (!) 35 (!) 84 % --  12/17/19 1550 (!) 59/35 -- -- -- -- -- --  12/17/19 1545 (!) 68/47 98.6 F (37 C) Axillary -- -- -- --  12/17/19 1520 -- -- -- -- -- -- 5\' 1"  (1.549 m)  12/17/19 1500 96/65 -- -- 94 13 93 % --  12/17/19 1400 99/67 -- -- 93 12 94 % --  12/17/19 1200 102/68 (!) 96.9 F (36.1 C) Axillary 94 12 91 % --  12/17/19 1100 94/71 -- -- 93 11 94 % --  12/17/19 1000 (!) 81/62 -- -- 92 12 (!) 89 % --  12/17/19 0900 98/68 -- -- 86 14 92 % --  12/17/19 0800 (!) 88/62 -- -- 82 19 100 % --  12/17/19 0700 92/70 (!) 95.8 F (35.4 C) Axillary 80 (!) 24 100 % --  12/17/19 0600 93/74 -- -- 83 (!) 24 100 % --  12/17/19 0500 92/71 -- -- 79 (!) 24 100 % --  12/17/19 0400 (!) 87/68 -- -- 82 (!) 24 100 % --  12/17/19 0300 (!) 71/61 -- -- 85 14 100 % --  12/17/19 0210 -- -- -- -- -- 100 % --  12/17/19 0200 -- -- -- 86 18 -- --  12/16/19 2300 (!) 86/68 -- -- 95 (!) 24 100 % --  12/16/19 2200 (!) 83/62 -- -- 99 (!) 24 100 % --  12/16/19 2100 (!) 88/62 -- -- (!) 105 (!) 0 99 % --  12/16/19 2000 (!) 173/107 (!) 97 F (36.1 C) Oral (!) 129 11 100 % --  12/16/19 1959 -- -- -- -- -- 99 % --  O/E Intubated Sedated -propafol, fentanyl On pressors Chest b/l air entry, crepts Hs tachycardia foley catheter  CBC Latest Ref Rng & Units 12/17/2019 12/16/2019 12/19/2019  WBC 4.0 - 10.5 K/uL 23.2(H) 14.1(H) 12.8(H)  Hemoglobin 12.0 - 15.0 g/dL 12/17/2019 37.6 28.3  Hematocrit 36 - 46 %  37.1 36.7 40.1  Platelets 150 - 400 K/uL 266 361 417(H)    CMP Latest Ref Rng & Units 12/17/2019 12/16/2019 12/16/2019  Glucose 70 - 99 mg/dL 12/17/2019) 761(Y) 073(X)  BUN 8 - 23 mg/dL 106(Y) 16 15  Creatinine 0.44 - 1.00 mg/dL 69(S 8.54 6.27  Sodium 135 - 145 mmol/L 136 135 131(L)  Potassium 3.5 - 5.1 mmol/L 4.0 4.0 3.4(L)  Chloride 98 - 111 mmol/L 105 104 94(L)  CO2 22 - 32 mmol/L 20(L) 18(L) 23  Calcium 8.9 - 10.3 mg/dL 0.35) 8.1(L) 8.6(L)  Total Protein 6.5 - 8.1 g/dL 6.5 - 7.8  Total Bilirubin 0.3 - 1.2 mg/dL 1.1 - 1.6(H)  Alkaline Phos 38 - 126 U/L 109 - 90  AST 15 - 41 U/L 26 - 46(H)  ALT 0 - 44 U/L 30 - 41  Imaging   Extensive b/l airspace infiltrate Impression recommendation  Acute hypoxic respiratory failure due to Covid 19 pneumonia/ARDS Patient currently on IV ceftriaxone and azithromycin.  May continue for a total of 5 days and then stop.    Hypotension on pressors  She is on high-dose steroids which could explain the leukocytosis and high blood glucose.  She is also on remdesivir which may not be very helpful at this stage.  Poor prognosis  Discussed the management with the care team

## 2019-12-17 NOTE — Progress Notes (Signed)
Inpatient Diabetes Program Recommendations  AACE/ADA: New Consensus Statement on Inpatient Glycemic Control   Target Ranges:  Prepandial:   less than 140 mg/dL      Peak postprandial:   less than 180 mg/dL (1-2 hours)      Critically ill patients:  140 - 180 mg/dL  Results for [facility], [place] (MRN [id number]) as of 12/17/2019 07:21  Ref. Range 12/17/2019 05:04  Glucose Latest Ref Range: 70 - 99 mg/dL 462 (H)   Results for [facility], [place] (MRN [id number]) as of 12/17/2019 07:21  Ref. Range 12/16/2019 07:35 12/16/2019 14:41 12/17/2019 01:29 12/17/2019 03:31  Glucose-Capillary Latest Ref Range: 70 - 99 mg/dL 182 (H) 993 (H) 716 (H) 311 (H)  Results for [facility], [place] (MRN [id number]) as of 12/17/2019 07:21  Ref. Range 12/16/2019 10:39  Hemoglobin A1C Latest Ref Range: 4.8 - 5.6 % 10.8 (H)   Review of Glycemic Control  Diabetes history: DM2 Outpatient Diabetes medications: Lantus 40 units QHS, Novolog 16 units daily before breakfast Current orders for Inpatient glycemic control: Novolog 0-20 units Q4H  Inpatient Diabetes Program Recommendations:    Insulin: Please consider discontinuing current Novolog order and use ICU Glycemic Control order set Phase 2 IV insulin to improve glycemic control and help determine insulin needs.  NOTE: Patient admitted with sepsis, respiratory failure due to pneumonia and COVID. Per chart patient seen [facility] Endocrinology Dr. Su Monks last on 12/21/2017 and was prescribed Levemir 60 units QHS, Novolog 30 units with breakfast, and was started on Jardiance 10 mg daily at that visit. No other follow up visits noted in chart since then. PCP listed as [facility]. Noted patient was intubated yesterday around 6:30 pm.  CBGs in 300's over the past 8 hours. Recommend discontinuing SQ insulin and use ICU Glycemic Control Phase 2 IV insulin at this time to get glucose under control and to help determine insulin needs.  Thanks, Orlando Penner, RN, MSN, CDE Diabetes  Coordinator Inpatient Diabetes Program [phone] (Team Pager from 8am to 5pm)

## 2019-12-18 ENCOUNTER — Inpatient Hospital Stay: Payer: Medicare Other

## 2019-12-18 ENCOUNTER — Inpatient Hospital Stay: Payer: Self-pay

## 2019-12-18 DIAGNOSIS — E876 Hypokalemia: Secondary | ICD-10-CM | POA: Diagnosis not present

## 2019-12-18 DIAGNOSIS — A4189 Other specified sepsis: Secondary | ICD-10-CM | POA: Diagnosis not present

## 2019-12-18 LAB — COMPREHENSIVE METABOLIC PANEL
ALT: 27 U/L (ref 0–44)
AST: 24 U/L (ref 15–41)
Albumin: 2.3 g/dL — ABNORMAL LOW (ref 3.5–5.0)
Alkaline Phosphatase: 111 U/L (ref 38–126)
Anion gap: 10 (ref 5–15)
BUN: 59 mg/dL — ABNORMAL HIGH (ref 8–23)
CO2: 21 mmol/L — ABNORMAL LOW (ref 22–32)
Calcium: 8.6 mg/dL — ABNORMAL LOW (ref 8.9–10.3)
Chloride: 107 mmol/L (ref 98–111)
Creatinine, Ser: 1.02 mg/dL — ABNORMAL HIGH (ref 0.44–1.00)
GFR, Estimated: 58 mL/min — ABNORMAL LOW (ref >60)
Glucose, Bld: 314 mg/dL — ABNORMAL HIGH (ref 70–99)
Potassium: 4.5 mmol/L (ref 3.5–5.1)
Sodium: 138 mmol/L (ref 135–145)
Total Bilirubin: 1.1 mg/dL (ref 0.3–1.2)
Total Protein: 6.4 g/dL — ABNORMAL LOW (ref 6.5–8.1)

## 2019-12-18 LAB — BLOOD GAS, ARTERIAL
Acid-base deficit: 4.8 mmol/L — ABNORMAL HIGH (ref 0.0–2.0)
Bicarbonate: 22.1 mmol/L (ref 20.0–28.0)
FIO2: 0.4
MECHVT: 420 mL
O2 Saturation: 93.4 %
PEEP: 10 cmH2O
Patient temperature: 37
RATE: 24 resp/min
pCO2 arterial: 47 mmHg (ref 32.0–48.0)
pH, Arterial: 7.28 — ABNORMAL LOW (ref 7.350–7.450)
pO2, Arterial: 77 mmHg — ABNORMAL LOW (ref 83.0–108.0)

## 2019-12-18 LAB — FIBRIN DERIVATIVES D-DIMER (ARMC ONLY): Fibrin derivatives D-dimer (ARMC): >7500 ng/mL (FEU) — ABNORMAL HIGH (ref 0.00–499.00)

## 2019-12-18 LAB — CBC
HCT: 37.3 % (ref 36.0–46.0)
Hemoglobin: 12.1 g/dL (ref 12.0–15.0)
MCH: 29.1 pg (ref 26.0–34.0)
MCHC: 32.4 g/dL (ref 30.0–36.0)
MCV: 89.7 fL (ref 80.0–100.0)
Platelets: 268 10*3/uL (ref 150–400)
RBC: 4.16 MIL/uL (ref 3.87–5.11)
RDW: 13.3 % (ref 11.5–15.5)
WBC: 24.5 10*3/uL — ABNORMAL HIGH (ref 4.0–10.5)
nRBC: 0.1 % (ref 0.0–0.2)

## 2019-12-18 LAB — MAGNESIUM: Magnesium: 2.8 mg/dL — ABNORMAL HIGH (ref 1.7–2.4)

## 2019-12-18 LAB — QUANTIFERON-TB GOLD PLUS (RQFGPL)
QuantiFERON Mitogen Value: 0.02 IU/mL
QuantiFERON Nil Value: 0 IU/mL
QuantiFERON TB1 Ag Value: 0 IU/mL
QuantiFERON TB2 Ag Value: 0 IU/mL

## 2019-12-18 LAB — QUANTIFERON-TB GOLD PLUS: QuantiFERON-TB Gold Plus: UNDETERMINED — AB

## 2019-12-18 LAB — GLUCOSE, CAPILLARY
Glucose-Capillary: 269 mg/dL — ABNORMAL HIGH (ref 70–99)
Glucose-Capillary: 296 mg/dL — ABNORMAL HIGH (ref 70–99)
Glucose-Capillary: 193 mg/dL — ABNORMAL HIGH (ref 70–99)
Glucose-Capillary: 187 mg/dL — ABNORMAL HIGH (ref 70–99)
Glucose-Capillary: 197 mg/dL — ABNORMAL HIGH (ref 70–99)

## 2019-12-18 LAB — FERRITIN: Ferritin: 707 ng/mL — ABNORMAL HIGH (ref 11–307)

## 2019-12-18 LAB — PHOSPHORUS: Phosphorus: 3.6 mg/dL (ref 2.5–4.6)

## 2019-12-18 LAB — C-REACTIVE PROTEIN: CRP: 14.3 mg/dL — ABNORMAL HIGH (ref <1.0)

## 2019-12-18 LAB — LACTATE DEHYDROGENASE: LDH: 399 U/L — ABNORMAL HIGH (ref 98–192)

## 2019-12-18 LAB — TRIGLYCERIDES: Triglycerides: 210 mg/dL — ABNORMAL HIGH (ref <150)

## 2019-12-18 MED ORDER — SODIUM CHLORIDE 0.9% FLUSH
10.0000 mL | Freq: Two times a day (BID) | INTRAVENOUS | Status: DC
  Administered 2019-12-18 – 2019-12-19 (×2): 10 mL

## 2019-12-18 MED ORDER — INSULIN GLARGINE 100 UNIT/ML ~~LOC~~ SOLN
10.0000 [IU] | Freq: Two times a day (BID) | SUBCUTANEOUS | Status: DC
  Administered 2019-12-18 – 2019-12-19 (×3): 10 [IU] via SUBCUTANEOUS
  Filled 2019-12-18 (×4): qty 0.1

## 2019-12-18 MED ORDER — MIDAZOLAM 50MG/50ML (1MG/ML) PREMIX INFUSION
0.5000 mg/h | INTRAVENOUS | Status: DC
  Administered 2019-12-18: 6 mg/h via INTRAVENOUS
  Administered 2019-12-18: 0.5 mg/h via INTRAVENOUS
  Administered 2019-12-18: 8 mg/h via INTRAVENOUS
  Administered 2019-12-18 – 2019-12-19 (×2): 10 mg/h via INTRAVENOUS
  Administered 2019-12-19: 8 mg/h via INTRAVENOUS
  Administered 2019-12-19 – 2019-12-20 (×3): 10 mg/h via INTRAVENOUS
  Filled 2019-12-18 (×9): qty 50

## 2019-12-18 MED ORDER — ALBUMIN HUMAN 25 % IV SOLN
12.5000 g | Freq: Once | INTRAVENOUS | Status: AC
  Administered 2019-12-18: 12.5 g via INTRAVENOUS
  Filled 2019-12-18: qty 50

## 2019-12-18 MED ORDER — INSULIN GLARGINE 100 UNIT/ML ~~LOC~~ SOLN
10.0000 [IU] | Freq: Every day | SUBCUTANEOUS | Status: DC
  Administered 2019-12-18: 10 [IU] via SUBCUTANEOUS
  Filled 2019-12-18 (×2): qty 0.1

## 2019-12-18 MED ORDER — SODIUM CHLORIDE 0.9% FLUSH: 10.0000 mL | INTRAVENOUS | Status: DC | PRN

## 2019-12-18 MED ORDER — PROSOURCE TF PO LIQD
45.0000 mL | Freq: Two times a day (BID) | ORAL | Status: DC
  Administered 2019-12-18 – 2019-12-19 (×3): 45 mL
  Filled 2019-12-18 (×4): qty 45

## 2019-12-18 MED ORDER — FUROSEMIDE 10 MG/ML IJ SOLN
40.0000 mg | Freq: Once | INTRAMUSCULAR | Status: AC
  Administered 2019-12-18: 40 mg via INTRAVENOUS
  Filled 2019-12-18: qty 4

## 2019-12-18 MED ORDER — FENTANYL BOLUS VIA INFUSION
100.0000 ug | Freq: Once | INTRAVENOUS | Status: AC
  Administered 2019-12-18: 100 ug via INTRAVENOUS
  Filled 2019-12-18: qty 100

## 2019-12-18 MED ORDER — VITAL AF 1.2 CAL PO LIQD
1000.0000 mL | ORAL | Status: DC
  Administered 2019-12-18: 1000 mL

## 2019-12-18 MED ORDER — MIDAZOLAM HCL 2 MG/2ML IJ SOLN
2.0000 mg | Freq: Once | INTRAMUSCULAR | Status: AC
  Administered 2019-12-18: 2 mg via INTRAVENOUS

## 2019-12-18 NOTE — Progress Notes (Signed)
Nutrition Follow-up  DOCUMENTATION CODES:   Not applicable  INTERVENTION:  Initiate new goal TF regimen of Vital AF 1.2 Cal at 40 mL/hr (960 mL goal daily volume) + PROSource TF 45 mL BID per tube. Provides 1232 kcal, 94 grams of protein, 778 mL H2O daily.  Continue MVI daily per tube.  NUTRITION DIAGNOSIS:   Inadequate oral intake related to inability to eat as evidenced by NPO status.  Ongoing.  GOAL:   Patient will meet greater than or equal to 90% of their needs  Met with TF regimen.  MONITOR:   Vent status, Labs, Weight trends, TF tolerance, Skin, I & O's  REASON FOR ASSESSMENT:   Ventilator    ASSESSMENT:   63 year old female with PMHx of DM admitted with COVID-19 PNA.  10/19 transferred to ICU and intubated  Patient is currently intubated on ventilator support MV: 8.2 L/min Temp (24hrs), Avg:97.4 F (36.3 C), Min:95.6 F (35.3 C), Max:98.6 F (37 C)  Medications reviewed and include: Colace 100 mg BID, Novolog 0-20 units Q4hrs, Lantus 10 units BID, MVI daily, Protonix, Miralax, azithromycin, ceftriaxone, fentanyl gtt, Versed gtt, norepinephrine gtt at 7 mcg/min, remdesivir.  Labs reviewed: CBG 193-296, CO2 21, BUN 59, Creatinine 1.02, Magnesium 2.8.  I/O: 260 mL UOP yesterday (0.2 mL/kg/hr)  Weight trend: 55.7 kg on 10/21; -0.7 kg from 10/18  Enteral Access: 16 Fr. OGT placed 10/19; terminates in stomach per abdominal x-ray 10/19  TF regimen: Vital AF 1.2 Cal at 25 mL/hr + PROSource 90 mL BID  Discussed with RN and on rounds. Patient now off propofol gtt.  NUTRITION - FOCUSED PHYSICAL EXAM:    Most Recent Value  Orbital Region No depletion  Upper Arm Region No depletion  Thoracic and Lumbar Region No depletion  Buccal Region Unable to assess  Temple Region Mild depletion  Clavicle Bone Region No depletion  Clavicle and Acromion Bone Region No depletion  Scapular Bone Region Unable to assess  Dorsal Hand No depletion  Patellar Region No  depletion  Anterior Thigh Region No depletion  Posterior Calf Region Mild depletion  Edema (RD Assessment) --  [non pitting]  Hair Reviewed  Eyes Unable to assess  Mouth Unable to assess  Skin Reviewed  Nails Reviewed     Diet Order:   Diet Order    None     EDUCATION NEEDS:   Not appropriate for education at this time  Skin:  Skin Assessment: Reviewed RN Assessment  Last BM:  Unknown/PTA  Height:   Ht Readings from Last 1 Encounters:  12/17/19 5' 1" (1.549 m)   Weight:   Wt Readings from Last 1 Encounters:  12/18/19 55.7 kg   Ideal Body Weight:  47.7 kg  BMI:  Body mass index is 23.2 kg/m.  Estimated Nutritional Needs:   Kcal:  1127kcal/day  Protein:  85-95 grams  Fluid:  1.4-1.6L/day  Leanne King, MS, RD, LDN Pager number available on Amion 

## 2019-12-18 NOTE — Progress Notes (Signed)
Palliative:  Chart reviewed - discussed with RN and Judeth Cornfield, NP Judeth Cornfield had long conversation with family moments ago - will plan to follow up with family in AM for full GOC conversation.  Gerlean Ren, DNP, AGNP-C Palliative Medicine Team Team Phone # [phone]  Pager # [phone]  NO CHARGE

## 2019-12-18 NOTE — Progress Notes (Signed)
CRITICAL CARE NOTE  Brief HPI:  64yo female admitted 10/18 after being sick for 2 weeks w/difficulty breathing, fever, dry cough and poor intake and found to have COVID Pneumonia. Patient is unvaccinated. COVID PCR +10/18. Intubated 10/19.   10/19 progressive resp failure transferred to ICU and emergently intubated 10/20 Received Vec x1 overnight, weaning FIO2, GI prophy added, Tube feeds started, Increase insulin coverage, steroids discontinued  CC  Acute respiratory failure due to COVID Pneumonia  Subjective: tmax 99.6 IOs: +1.17L Dysschronus w/vent w/any movement Triglycerides >200   Active Ambulatory Problems    Diagnosis Date Noted  . No Active Ambulatory Problems   Resolved Ambulatory Problems    Diagnosis Date Noted  . No Resolved Ambulatory Problems   Past Medical History:  Diagnosis Date  . Diabetes mellitus without complication [facility])    Past Surgical History:  Procedure Laterality Date  . ABDOMINAL HYSTERECTOMY       BP 120/76   Pulse (!) 108   Temp (!) 95.9 F (35.5 C) (Axillary)   Resp 16   Ht 5\' 1"  (1.549 m)   Wt 55.7 kg   SpO2 92%   BMI 23.20 kg/m    I/O last 3 completed shifts: In: 1608 [I.V.:1158; IV Piggyback:450] Out: 860 [Urine:860] No intake/output data recorded.  SpO2: 92 % O2 Flow Rate (L/min): 55 L/min FiO2 (%): 40 %  Estimated body mass index is 23.2 kg/m as calculated from the following:   Height as of this encounter: 5\' 1"  (1.549 m).   Weight as of this encounter: 55.7 kg.   REVIEW OF SYSTEMS Unable to assess due to clinical condition    PHYSICAL EXAMINATION:  GENERAL:critically ill appearing, +resp distress HEAD: Normocephalic, atraumatic.  EYES: Pupils equal, round, reactive to light.  No scleral icterus.  MOUTH: Moist mucosal membrane. NECK: Supple. No thyromegaly. No nodules. No JVD.  PULMONARY: +rhonchi, +wheezing CARDIOVASCULAR: S1 and S2. Regular rate and rhythm. No murmurs, rubs, or gallops.   GASTROINTESTINAL: Soft, nontender, -distended. Positive bowel sounds.  MUSCULOSKELETAL: No swelling, clubbing, or edema.  NEUROLOGIC: obtunded SKIN:intact,warm,dry   MEDICATIONS: I have reviewed all medications and confirmed regimen as documented   CULTURE RESULTS   Recent Results (from the past 240 hour(s))  Blood Culture (routine x 2)     Status: None (Preliminary result)   Collection Time: 12/25/2019  8:03 AM   Specimen: BLOOD  Result Value Ref Range Status   Specimen Description BLOOD LEFT ANTECUBITAL  Final   Special Requests   Final    BOTTLES DRAWN AEROBIC AND ANAEROBIC Blood Culture adequate volume   Culture   Final    NO GROWTH 3 DAYS Performed at [facility], [address], [place], [address] [place]    Report Status PENDING  Incomplete  Urine culture     Status: None   Collection Time: 12/17/2019  8:03 AM   Specimen: In/Out Cath Urine  Result Value Ref Range Status   Specimen Description   Final    IN/OUT CATH URINE Performed at [facility], [address], [place], [address] [place]    Special Requests   Final    NONE Performed at [facility], [address], [place], [address] [place]    Culture   Final    NO GROWTH Performed at [facility], [address] [place], [address] [place]    Report Status 12/17/2019 FINAL  Final  Blood Culture (routine x 2)     Status: None (Preliminary  result)   Collection Time: 01-09-2020  8:07 AM   Specimen: BLOOD  Result Value Ref Range Status   Specimen Description BLOOD BLOOD LEFT FOREARM  Final   Special Requests   Final    BOTTLES DRAWN AEROBIC AND ANAEROBIC Blood Culture adequate volume   Culture   Final    NO GROWTH 3 DAYS Performed at [facility], [address] [place], [place] [address]    Report Status PENDING  Incomplete  Resp Panel by RT PCR (RSV, Flu A&B, Covid) -     Status: None   Collection Time: 01-09-2020  8:52 AM  Result Value Ref Range Status   SARS  Coronavirus 2 by RT PCR NEGATIVE NEGATIVE Final    Comment: (NOTE) SARS-CoV-2 target nucleic acids are NOT DETECTED.  The SARS-CoV-2 RNA is generally detectable in upper respiratoy specimens during the acute phase of infection. The lowest concentration of SARS-CoV-2 viral copies this assay can detect is 131 copies/mL. A negative result does not preclude SARS-Cov-2 infection and should not be used as the sole basis for treatment or other patient management decisions. A negative result may occur with  improper specimen collection/handling, submission of specimen other than nasopharyngeal swab, presence of viral mutation(s) within the areas targeted by this assay, and inadequate number of viral copies (<131 copies/mL). A negative result must be combined with clinical observations, patient history, and epidemiological information. The expected result is Negative.  Fact Sheet for Patients:  https://www.moore.com/https://www.fda.gov/media/142436/download  Fact Sheet for Healthcare Providers:  https://www.young.biz/https://www.fda.gov/media/142435/download  This test is no t yet approved or cleared by the [place] FDA and  has been authorized for detection and/or diagnosis of SARS-CoV-2 by FDA under an Emergency Use Authorization (EUA). This EUA will remain  in effect (meaning this test can be used) for the duration of the COVID-19 declaration under Section 564(b)(1) of the Act, 21 U.S.C. section 360bbb-3(b)(1), unless the authorization is terminated or revoked sooner.     Influenza A by PCR NEGATIVE NEGATIVE Final   Influenza B by PCR NEGATIVE NEGATIVE Final    Comment: (NOTE) The Xpert Xpress SARS-CoV-2/FLU/RSV assay is intended as an aid in  the diagnosis of influenza from Nasopharyngeal swab specimens and  should not be used as a sole basis for treatment. Nasal washings and  aspirates are unacceptable for Xpert Xpress SARS-CoV-2/FLU/RSV  testing.  Fact Sheet for  Patients: https://www.moore.com/https://www.fda.gov/media/142436/download  Fact Sheet for Healthcare Providers: https://www.young.biz/https://www.fda.gov/media/142435/download  This test is not yet approved or cleared by the [place] FDA and  has been authorized for detection and/or diagnosis of SARS-CoV-2 by  FDA under an Emergency Use Authorization (EUA). This EUA will remain  in effect (meaning this test can be used) for the duration of the  Covid-19 declaration under Section 564(b)(1) of the Act, 21  U.S.C. section 360bbb-3(b)(1), unless the authorization is  terminated or revoked.    Respiratory Syncytial Virus by PCR NEGATIVE NEGATIVE Final    Comment: (NOTE) Fact Sheet for Patients: https://www.moore.com/https://www.fda.gov/media/142436/download  Fact Sheet for Healthcare Providers: https://www.young.biz/https://www.fda.gov/media/142435/download  This test is not yet approved or cleared by the [place] FDA and  has been authorized for detection and/or diagnosis of SARS-CoV-2 by  FDA under an Emergency Use Authorization (EUA). This EUA will remain  in effect (meaning this test can be used) for the duration of the  COVID-19 declaration under Section 564(b)(1) of the Act, 21 U.S.C.  section 360bbb-3(b)(1), unless the authorization is terminated or  revoked. Performed at [facility], [address] [place], [place] [address]   Respiratory Panel by RT PCR (Flu A&B, Covid) -     Status: Abnormal   Collection Time: 01-06-20 12:55 PM  Result Value Ref Range Status   SARS Coronavirus 2 by RT PCR POSITIVE (A) NEGATIVE Final    Comment: RESULT CALLED TO, READ BACK BY AND VERIFIED WITH: DAVID, RACUEL AT 2009 ON 01-06-20 BY SS (NOTE) SARS-CoV-2 target nucleic acids are DETECTED.  SARS-CoV-2 RNA is generally detectable in upper respiratory specimens  during the acute phase of infection. Positive results are indicative of the presence of the identified virus, but do not rule out bacterial infection or co-infection with other pathogens  not detected by the test. Clinical correlation with patient history and other diagnostic information is necessary to determine patient infection status. The expected result is Negative.  Fact Sheet for Patients:  https://www.moore.com/  Fact Sheet for Healthcare Providers: https://www.young.biz/  This test is not yet approved or cleared by the [place] FDA and  has been authorized for detection and/or diagnosis of SARS-CoV-2 by FDA under an Emergency Use Authorization (EUA).  This EUA will remain in effect (meaning this test ca n be used) for the duration of  the COVID-19 declaration under Section 564(b)(1) of the Act, 21 U.S.C. section 360bbb-3(b)(1), unless the authorization is terminated or revoked sooner.      Influenza A by PCR NEGATIVE NEGATIVE Final   Influenza B by PCR NEGATIVE NEGATIVE Final    Comment: (NOTE) The Xpert Xpress SARS-CoV-2/FLU/RSV assay is intended as an aid in  the diagnosis of influenza from Nasopharyngeal swab specimens and  should not be used as a sole basis for treatment. Nasal washings and  aspirates are unacceptable for Xpert Xpress SARS-CoV-2/FLU/RSV  testing.  Fact Sheet for Patients: https://www.moore.com/  Fact Sheet for Healthcare Providers: https://www.young.biz/  This test is not yet approved or cleared by the [place] FDA and  has been authorized for detection and/or diagnosis of SARS-CoV-2 by  FDA under an Emergency Use Authorization (EUA). This EUA will remain  in effect (meaning this test can be used) for the duration of the  Covid-19 declaration under Section 564(b)(1) of the Act, 21  U.S.C. section 360bbb-3(b)(1), unless the authorization is  terminated or revoked. Performed at [facility], [address] [place], [place] [address]   MRSA PCR Screening     Status: None   Collection Time: 12/16/19  8:41 PM   Specimen:  Nasopharyngeal  Result Value Ref Range Status   MRSA by PCR NEGATIVE NEGATIVE Final    Comment:        The GeneXpert MRSA Assay (FDA approved for NASAL specimens only), is one component of a comprehensive MRSA colonization surveillance program. It is not intended to diagnose MRSA infection nor to guide or monitor treatment for MRSA infections. Performed at [facility], [address] [place], [place] [address]   Respiratory Panel by RT PCR (Flu A&B, Covid) - Nasopharyngeal Swab     Status: Abnormal   Collection Time: 12/17/19 10:37 AM   Specimen: Nasopharyngeal Swab  Result Value Ref Range Status   SARS Coronavirus 2 by RT PCR POSITIVE (A) NEGATIVE Final    Comment: RESULT CALLED TO, READ BACK BY AND VERIFIED WITH: BROOKE DARNELL 12/17/19 1135 KLW (NOTE) SARS-CoV-2 target nucleic acids are DETECTED.  SARS-CoV-2 RNA is generally detectable in upper respiratory specimens  during the acute phase of infection. Positive results are indicative of the presence of the identified virus, but  do not rule out bacterial infection or co-infection with other pathogens not detected by the test. Clinical correlation with patient history and other diagnostic information is necessary to determine patient infection status. The expected result is Negative.  Fact Sheet for Patients:  https://www.moore.com/  Fact Sheet for Healthcare Providers: https://www.young.biz/  This test is not yet approved or cleared by the [place] FDA and  has been authorized for detection and/or diagnosis of SARS-CoV-2 by FDA under an Emergency Use Authorization (EUA).  This EUA will remain in effect (meaning this test can be [place] ed) for the duration of  the COVID-19 declaration under Section 564(b)(1) of the Act, 21 U.S.C. section 360bbb-3(b)(1), unless the authorization is terminated or revoked sooner.      Influenza A by PCR NEGATIVE NEGATIVE Final    Influenza B by PCR NEGATIVE NEGATIVE Final    Comment: (NOTE) The Xpert Xpress SARS-CoV-2/FLU/RSV assay is intended as an aid in  the diagnosis of influenza from Nasopharyngeal swab specimens and  should not be used as a sole basis for treatment. Nasal washings and  aspirates are unacceptable for Xpert Xpress SARS-CoV-2/FLU/RSV  testing.  Fact Sheet for Patients: https://www.moore.com/  Fact Sheet for Healthcare Providers: https://www.young.biz/  This test is not yet approved or cleared by the [place] FDA and  has been authorized for detection and/or diagnosis of SARS-CoV-2 by  FDA under an Emergency Use Authorization (EUA). This EUA will remain  in effect (meaning this test can be used) for the duration of the  Covid-19 declaration under Section 564(b)(1) of the Act, 21  U.S.C. section 360bbb-3(b)(1), unless the authorization is  terminated or revoked. Performed at [facility], [address] [place], [place] [address]         Vent Mode: PRVC FiO2 (%):  [40 %] 40 % Set Rate:  [24 bmp] 24 bmp Vt Set:  [420 mL] 420 mL PEEP:  [10 cmH20] 10 cmH20 Plateau Pressure:  [22 cmH20-24 cmH20] 24 cmH20 CBC    Component Value Date/Time   WBC 24.5 (H) 12/18/2019 0449   RBC 4.16 12/18/2019 0449   HGB 12.1 12/18/2019 0449   HCT 37.3 12/18/2019 0449   PLT 268 12/18/2019 0449   MCV 89.7 12/18/2019 0449   MCH 29.1 12/18/2019 0449   MCHC 32.4 12/18/2019 0449   RDW 13.3 12/18/2019 0449   LYMPHSABS 1.5 2020/01/14 0803   MONOABS 0.7 Jan 14, 2020 0803   EOSABS 0.0 2020/01/14 0803   BASOSABS 0.0 14-Jan-2020 0803   CMP Latest Ref Rng & Units 12/18/2019 12/17/2019 12/16/2019  Glucose 70 - 99 mg/dL 599(J) 570(V) 779(T)  BUN 8 - 23 mg/dL 90(Z) 00(P) 16  Creatinine 0.44 - 1.00 mg/dL 2.33(A) 0.76 2.26  Sodium 135 - 145 mmol/L 138 136 135  Potassium 3.5 - 5.1 mmol/L 4.5 4.0 4.0  Chloride 98 - 111 mmol/L 107 105 104  CO2 22 - 32 mmol/L  21(L) 20(L) 18(L)  Calcium 8.9 - 10.3 mg/dL 3.3(H) 5.4(T) 8.1(L)  Total Protein 6.5 - 8.1 g/dL 6.4(L) 6.5 -  Total Bilirubin 0.3 - 1.2 mg/dL 1.1 1.1 -  Alkaline Phos 38 - 126 U/L 111 109 -  AST 15 - 41 U/L 24 26 -  ALT 0 - 44 U/L 27 30 -       Indwelling Urinary Catheter continued, requirement due to   Reason to continue Indwelling Urinary Catheter strict Intake/Output monitoring for hemodynamic instability         Ventilator continued, requirement due to severe  respiratory failure   Ventilator Sedation RASS 0 to -2      ASSESSMENT AND PLAN   Acute hypoxemic respiratory failure due to COVID-19 pneumonia / ARDS --Mechanical ventilation via ARDS protocol, target PRVC 6 cc/kg --Wean PEEP and FiO2 as able --Goal plateau pressure less than 30, driving pressure less than 15 --Paralytics if necessary for vent synchrony, gas exchange --Cycle prone positioning if necessary for oxygenation --Deep sedation per PAD protocol, goal RASS -4, currently fentanyl, midazolam --Albumin/Lasix challenge  --Remdesivir  --Steroids completed --Continue Ceftriaxone  Acute toxic metabolic encephalopathy --2/2 above  Stress induced Hyperglycemia --Continue SSI --Add long acting insulin  Prophlaxis: --GI: Protonix --DVT: Lovenox --Bowel: Colace  FEN: --No MIVFs --Replace as needed --Add Enteral feeds today  Family Engagement: I called and spoke w/the patients son and daughter today. They were concerned regarding the negative and then positive covid test. They requested a repeat test which has been ordered.  CODE Status: Full Dispo: To remain in ICU   Amanda Cockayne ACNP-BC

## 2019-12-18 NOTE — Progress Notes (Signed)
Amanda Cockayne, NP at bedside to see patient.  Order to increase Versed gtt  to 2 mg and give 2 mg Bolus of Versed.  If still asynchronous with vent increase to Versed to 4 mg.

## 2019-12-18 NOTE — Progress Notes (Signed)
Peripherally Inserted Central Catheter Placement  The IV Nurse has discussed with the patient and/or persons authorized to consent for the patient, the purpose of this procedure and the potential benefits and risks involved with this procedure.  The benefits include less needle sticks, lab draws from the catheter, and the patient may be discharged home with the catheter. Risks include, but not limited to, infection, bleeding, blood clot (thrombus formation), and puncture of an artery; nerve damage and irregular heartbeat and possibility to perform a PICC exchange if needed/ordered by physician.  Alternatives to this procedure were also discussed.  Bard Power PICC patient education guide, fact sheet on infection prevention and patient information card has been provided to patient /or left at bedside.    PICC Placement Documentation  PICC Triple Lumen 12/18/19 PICC Right Basilic 31 cm 0 cm (Active)  Exposed Catheter (cm) 0 cm 12/18/19 1629  Site Assessment Clean;Dry;Intact 12/18/19 1629  Lumen #1 Status Flushed;Blood return noted;Saline locked 12/18/19 1629  Lumen #2 Status Flushed;Blood return noted;Saline locked 12/18/19 1629  Lumen #3 Status Flushed;Blood return noted;Saline locked 12/18/19 1629  Dressing Type Transparent;Securing device 12/18/19 1629  Dressing Status Clean;Dry;Intact 12/18/19 1629  Antimicrobial disc in place? Yes 12/18/19 1629  Safety Lock Not Applicable 12/18/19 1629  Dressing Change Due 12/25/19 12/18/19 1629       Romie Jumper 12/18/2019, 4:32 PM

## 2019-12-18 NOTE — Progress Notes (Signed)
ID Patient  Remains intubated Repeat SARS-CoV-2 test x2 is positive Patient Vitals for the past 24 hrs:  BP Temp Temp src Pulse Resp SpO2 Weight  12/18/19 1929 -- -- -- -- -- 94 % --  12/18/19 1900 97/68 100 F (37.8 C) -- (!) 118 (!) 24 95 % --  12/18/19 1800 96/69 99.9 F (37.7 C) -- (!) 121 (!) 24 92 % --  12/18/19 1700 (!) 85/68 99.3 F (37.4 C) -- (!) 117 (!) 24 91 % --  12/18/19 1519 -- -- -- -- -- 91 % --  12/18/19 1400 113/71 (!) 100.4 F (38 C) -- (!) 128 (!) 24 91 % --  12/18/19 1300 -- -- -- -- -- 92 % --  12/18/19 1200 118/75 99.7 F (37.6 C) -- (!) 114 16 92 % --  12/18/19 1100 116/78 99 F (37.2 C) -- (!) 111 20 95 % --  12/18/19 0837 -- -- -- -- -- 92 % --  12/18/19 0800 120/76 (!) 95.9 F (35.5 C) Axillary (!) 108 16 94 % --  12/18/19 0700 107/74 -- -- (!) 102 (!) 24 100 % --  12/18/19 0600 -- -- -- (!) 102 (!) 24 99 % --  12/18/19 0500 92/69 -- -- (!) 102 (!) 24 98 % 55.7 kg  12/18/19 0400 97/71 (!) 95.6 F (35.3 C) Axillary (!) 103 (!) 24 97 % --  12/18/19 0309 -- -- -- -- -- 97 % --  12/18/19 0300 102/74 -- -- (!) 109 (!) 24 97 % --  12/18/19 0200 101/73 -- -- (!) 110 (!) 24 96 % --  12/18/19 0100 103/72 -- -- (!) 113 (!) 24 96 % --  12/18/19 0000 108/74 98.2 F (36.8 C) Axillary (!) 114 (!) 24 94 % --  12/17/19 2300 105/69 -- -- 99 20 95 % --  12/17/19 2100 94/66 -- -- (!) 102 (!) 24 94 % --  O/E Intubated Sedated -propafol, fentanyl On pressors PICC line foley  CBC Latest Ref Rng & Units 12/18/2019 12/17/2019 12/16/2019  WBC 4.0 - 10.5 K/uL 24.5(H) 23.2(H) 14.1(H)  Hemoglobin 12.0 - 15.0 g/dL 07.6 22.6 33.3  Hematocrit 36 - 46 % 37.3 37.1 36.7  Platelets 150 - 400 K/uL 268 266 361    CMP Latest Ref Rng & Units 12/18/2019 12/17/2019 12/16/2019  Glucose 70 - 99 mg/dL 545(G) 256(L) 893(T)  BUN 8 - 23 mg/dL 34(K) 87(G) 16  Creatinine 0.44 - 1.00 mg/dL 8.11(X) 7.26 2.03  Sodium 135 - 145 mmol/L 138 136 135  Potassium 3.5 - 5.1 mmol/L 4.5 4.0 4.0   Chloride 98 - 111 mmol/L 107 105 104  CO2 22 - 32 mmol/L 21(L) 20(L) 18(L)  Calcium 8.9 - 10.3 mg/dL 5.5(H) 7.4(B) 8.1(L)  Total Protein 6.5 - 8.1 g/dL 6.4(L) 6.5 -  Total Bilirubin 0.3 - 1.2 mg/dL 1.1 1.1 -  Alkaline Phos 38 - 126 U/L 111 109 -  AST 15 - 41 U/L 24 26 -  ALT 0 - 44 U/L 27 30 -   Imaging   Extensive b/l airspace infiltrate Impression recommendation  Acute hypoxic respiratory failure due to Covid 19 pneumonia/ARDS Patient currently on IV ceftriaxone and azithromycin.  May continue for a total of 5 days    Hypotension on pressors  She is on high-dose steroids which could explain the leukocytosis and high blood glucose.  She is also on remdesivir which may not be very helpful at this stage.   Discussed  with the intensivist  NP ID will sign off- call if needed

## 2019-12-18 NOTE — Consult Note (Signed)
Consultation Note Date: 12/18/2019   Patient Name: Alexis Daugherty  DOB: 05-26-1955  MRN: [id number]  Age / Sex: 64 y.o., female  PCP: Department, [facility] Referring Physician: Flora Lipps, MD  Reason for Consultation: Establishing goals of care  HPI/Patient Profile: 64 y.o. female  with past medical history of DM admitted on 12/16/2019 with fever and shortness of breath. Found to be positive for COVID-19 pneumonia. Required emergent intubation 10/19. Requiring paralytics - dyssynchrony with ventilator. PMT consulted for [place] discussions.   Clinical Assessment and Goals of Care: I have reviewed medical records including EPIC notes, labs and imaging, received report from RN, assessed the patient and then spoke with patient's son and daughter, [name] and [name],  to discuss diagnosis prognosis, GOC, EOL wishes, disposition and options.  I introduced Palliative Medicine as specialized medical care for people living with serious illness. It focuses on providing relief from the symptoms and stress of a serious illness. The goal is to improve quality of life for both the patient and the family.  They tell me prior to patient's COVID infection, she was doing great. She was fully functional and independent. She loved to cook. She served as caregiver for her grandchildren.   We discussed patient's current illness and what it means in the larger context of patient's on-going co-morbidities. We discussed her COVID infection and dependence on ventilator and medications. Discussed concern of poor prognosis and long hospitalization.   Family asks multiple questions about medications patient is on and treatment options.   I attempted to elicit values and goals of care important to the patient. The difference between aggressive medical intervention and comfort care was considered in light of the patient's goals of care.   Encouraged patient/family to consider  DNR/DNI status understanding evidenced based poor outcomes in similar hospitalized patients. Long discussion about potential outcomes if patient were to suffer arrest and receive resuscitation attempts. Discussed concerns about quality of life. Family requested time to discuss information among themselves. After discussion they let me know they understand situation but continue to be interested in full code status and full scope interventions. They are open to any aggressive medical interventions offered.   Family share about their [place] and their hope for complete healing.   Discussed with family the importance of continued conversation with family and the medical providers regarding overall plan of care and treatment options, ensuring decisions are within the context of the patient's values and GOCs.    Questions and concerns were addressed. The family was encouraged to call with questions or concerns.   Primary Decision Maker NEXT OF KIN - daughter and son, Lah and [name]  SUMMARY OF RECOMMENDATIONS   - family educated on severity of illness, they express understanding - requesting full code/full scope - interested in all aggressive medical interventions offered.  Code Status/Advance Care Planning:  Full code  Prognosis:   Unable to determine  Discharge Planning: To Be Determined      Primary Diagnoses: Present on Admission: . Sepsis ([place]) . CAP (community acquired pneumonia) . Acute respiratory failure ([place]) . Suspected COVID-19 virus infection   I have reviewed the medical record, interviewed the patient and family, and examined the patient. The following aspects are pertinent.  Past Medical History:  Diagnosis Date  . Diabetes mellitus without complication [facility])    Social History   Socioeconomic History  . Marital status: Widowed    Spouse name: Not on file  . Number of children: Not on file  .  Years of education: Not on file  . Highest education level:  Not on file  Occupational History  . Not on file  Tobacco Use  . Smoking status: Never Smoker  Substance and Sexual Activity  . Alcohol use: No  . Drug use: Not on file  . Sexual activity: Not on file  Other Topics Concern  . Not on file  Social History Narrative  . Not on file   Social Determinants of Health   Financial Resource Strain:   . Difficulty of Paying Living Expenses: Not on file  Food Insecurity:   . Worried About Charity fundraiser in the Last Year: Not on file  . Ran Out of Food in the Last Year: Not on file  Transportation Needs:   . Lack of Transportation (Medical): Not on file  . Lack of Transportation (Non-Medical): Not on file  Physical Activity:   . Days of Exercise per Week: Not on file  . Minutes of Exercise per Session: Not on file  Stress:   . Feeling of Stress : Not on file  Social Connections:   . Frequency of Communication with Friends and Family: Not on file  . Frequency of Social Gatherings with Friends and Family: Not on file  . Attends Religious Services: Not on file  . Active Member of Clubs or Organizations: Not on file  . Attends Archivist Meetings: Not on file  . Marital Status: Not on file   History reviewed. No pertinent family history. Scheduled Meds: . chlorhexidine gluconate (MEDLINE KIT)  15 mL Mouth Rinse BID  . Chlorhexidine Gluconate Cloth  6 each Topical Daily  . docusate  100 mg Oral BID  . enoxaparin (LOVENOX) injection  40 mg Subcutaneous Q24H  . feeding supplement (PROSource TF)  45 mL Per Tube BID  . feeding supplement (VITAL AF 1.2 CAL)  1,000 mL Per Tube Q24H  . fentaNYL (SUBLIMAZE) injection  25 mcg Intravenous Once  . insulin aspart  0-20 Units Subcutaneous Q4H  . insulin glargine  10 Units Subcutaneous BID  . mouth rinse  15 mL Mouth Rinse 10 times per day  . multivitamin with minerals  1 tablet Per Tube Daily  . pantoprazole (PROTONIX) IV  40 mg Intravenous Q24H  . polyethylene glycol  17 g Oral  Daily   Continuous Infusions: . azithromycin 500 mg (12/18/19 0841)  . cefTRIAXone (ROCEPHIN)  IV 2 g (12/18/19 0751)  . fentaNYL infusion INTRAVENOUS 200 mcg/hr (12/18/19 1313)  . midazolam 8 mg/hr (12/18/19 1315)  . norepinephrine (LEVOPHED) Adult infusion 5 mcg/min (12/18/19 1258)  . propofol (DIPRIVAN) infusion Stopped (12/18/19 0741)  . remdesivir 100 mg in NS 100 mL 100 mg (12/18/19 1010)   PRN Meds:.acetaminophen **OR** acetaminophen, fentaNYL, ondansetron **OR** ondansetron (ZOFRAN) IV, vecuronium No Known Allergies Review of Systems  Unable to perform ROS: Intubated    Physical Exam Constitutional:      Comments: Intubated, sedated  Cardiovascular:     Rate and Rhythm: Tachycardia present.     Vital Signs: BP 113/71   Pulse (!) 128   Temp (!) 100.4 F (38 C)   Resp (!) 24   Ht _0  (1.549 m)   Wt 55.7 kg   SpO2 91%   BMI 23.20 kg/m  Pain Scale: CPOT       SpO2: SpO2: 91 % O2 Device:SpO2: 91 % O2 Flow Rate: .O2 Flow Rate (L/min): 55 L/min  IO: Intake/output summary:   Intake/Output Summary (Last 24 hours)  at 12/18/2019 1518 Last data filed at 12/18/2019 0600 Gross per 24 hour  Intake 942.86 ml  Output 260 ml  Net 682.86 ml    LBM:   Baseline Weight: Weight: 56.4 kg Most recent weight: Weight: 55.7 kg     Palliative Assessment/Data: PPS 30%    Time Total: 60 minutes Greater than 50%  of this time was spent counseling and coordinating care related to the above assessment and plan.  Juel Burrow, DNP, AGNP-C Palliative Medicine Team [phone] Pager: [phone]

## 2019-12-18 NOTE — Progress Notes (Signed)
Inpatient Diabetes Program Recommendations  AACE/ADA: New Consensus Statement on Inpatient Glycemic Control (2015)  Target Ranges:  Prepandial:   less than 140 mg/dL      Peak postprandial:   less than 180 mg/dL (1-2 hours)      Critically ill patients:  140 - 180 mg/dL   Results for [facility], Shia (MRN [id number]) as of 12/18/2019 08:49  Ref. Range 12/17/2019 23:09 12/18/2019 04:24 12/18/2019 07:35  Glucose-Capillary Latest Ref Range: 70 - 99 mg/dL 546 (H) 568 (H) 127 (H)     Home DM Meds: Lantus 40 units QHS       Novolog 16 units daily before breakfast  Current Orders: Lantus 10 units BID      Novolog Resistant Correction Scale/ SSI (0-20 units) Q4 hours     MD- Note Lantus started at 3am today and Solumedrol also stopped.  May want to consider adding Novolog Tube Feed Coverage to current Insulin regimen:  Novolog 3 units Q4 hours  HOLD if tube feeds HELD for any reason    --Will follow patient during hospitalization--  Ambrose Finland RN, MSN, CDE Diabetes Coordinator Inpatient Glycemic Control Team Team Pager: [phone] (8a-5p)

## 2019-12-19 ENCOUNTER — Inpatient Hospital Stay: Payer: Medicare Other

## 2019-12-19 ENCOUNTER — Encounter: Payer: Self-pay | Admitting: Internal Medicine

## 2019-12-19 ENCOUNTER — Other Ambulatory Visit: Payer: Self-pay

## 2019-12-19 DIAGNOSIS — Z515 Encounter for palliative care: Secondary | ICD-10-CM | POA: Diagnosis not present

## 2019-12-19 DIAGNOSIS — U071 COVID-19: Secondary | ICD-10-CM | POA: Diagnosis not present

## 2019-12-19 DIAGNOSIS — J9601 Acute respiratory failure with hypoxia: Secondary | ICD-10-CM | POA: Diagnosis not present

## 2019-12-19 DIAGNOSIS — A4189 Other specified sepsis: Secondary | ICD-10-CM | POA: Diagnosis not present

## 2019-12-19 DIAGNOSIS — Z20822 Contact with and (suspected) exposure to covid-19: Secondary | ICD-10-CM

## 2019-12-19 DIAGNOSIS — Z7189 Other specified counseling: Secondary | ICD-10-CM | POA: Diagnosis not present

## 2019-12-19 DIAGNOSIS — E872 Acidosis: Secondary | ICD-10-CM | POA: Diagnosis not present

## 2019-12-19 DIAGNOSIS — E876 Hypokalemia: Secondary | ICD-10-CM | POA: Diagnosis not present

## 2019-12-19 DIAGNOSIS — E0800 Diabetes mellitus due to underlying condition with hyperosmolarity without nonketotic hyperglycemic-hyperosmolar coma (NKHHC): Secondary | ICD-10-CM | POA: Diagnosis not present

## 2019-12-19 LAB — LACTATE DEHYDROGENASE: LDH: 329 U/L — ABNORMAL HIGH (ref 98–192)

## 2019-12-19 LAB — FERRITIN: Ferritin: 487 ng/mL — ABNORMAL HIGH (ref 11–307)

## 2019-12-19 LAB — BLOOD GAS, ARTERIAL
Acid-base deficit: 6.9 mmol/L — ABNORMAL HIGH (ref 0.0–2.0)
Bicarbonate: 20.9 mmol/L (ref 20.0–28.0)
FIO2: 0.6
MECHVT: 420 mL
O2 Saturation: 85.9 %
PEEP: 10 cmH2O
Patient temperature: 37
RATE: 24 resp/min
pCO2 arterial: 51 mmHg — ABNORMAL HIGH (ref 32.0–48.0)
pH, Arterial: 7.22 — ABNORMAL LOW (ref 7.350–7.450)
pO2, Arterial: 62 mmHg — ABNORMAL LOW (ref 83.0–108.0)

## 2019-12-19 LAB — GLUCOSE, CAPILLARY
Glucose-Capillary: 248 mg/dL — ABNORMAL HIGH (ref 70–99)
Glucose-Capillary: 254 mg/dL — ABNORMAL HIGH (ref 70–99)
Glucose-Capillary: 260 mg/dL — ABNORMAL HIGH (ref 70–99)
Glucose-Capillary: 286 mg/dL — ABNORMAL HIGH (ref 70–99)
Glucose-Capillary: 298 mg/dL — ABNORMAL HIGH (ref 70–99)
Glucose-Capillary: 342 mg/dL — ABNORMAL HIGH (ref 70–99)

## 2019-12-19 LAB — COMPREHENSIVE METABOLIC PANEL
ALT: 27 U/L (ref 0–44)
AST: 33 U/L (ref 15–41)
Albumin: 2 g/dL — ABNORMAL LOW (ref 3.5–5.0)
Alkaline Phosphatase: 75 U/L (ref 38–126)
Anion gap: 8 (ref 5–15)
BUN: 60 mg/dL — ABNORMAL HIGH (ref 8–23)
CO2: 21 mmol/L — ABNORMAL LOW (ref 22–32)
Calcium: 7 mg/dL — ABNORMAL LOW (ref 8.9–10.3)
Chloride: 113 mmol/L — ABNORMAL HIGH (ref 98–111)
Creatinine, Ser: 0.85 mg/dL (ref 0.44–1.00)
GFR, Estimated: >60 mL/min (ref >60)
Glucose, Bld: 299 mg/dL — ABNORMAL HIGH (ref 70–99)
Potassium: 3.4 mmol/L — ABNORMAL LOW (ref 3.5–5.1)
Sodium: 142 mmol/L (ref 135–145)
Total Bilirubin: 0.8 mg/dL (ref 0.3–1.2)
Total Protein: 5.2 g/dL — ABNORMAL LOW (ref 6.5–8.1)

## 2019-12-19 LAB — CBC
HCT: 32.4 % — ABNORMAL LOW (ref 36.0–46.0)
Hemoglobin: 10.2 g/dL — ABNORMAL LOW (ref 12.0–15.0)
MCH: 28.9 pg (ref 26.0–34.0)
MCHC: 31.5 g/dL (ref 30.0–36.0)
MCV: 91.8 fL (ref 80.0–100.0)
Platelets: 150 10*3/uL (ref 150–400)
RBC: 3.53 MIL/uL — ABNORMAL LOW (ref 3.87–5.11)
RDW: 13.7 % (ref 11.5–15.5)
WBC: 12.8 10*3/uL — ABNORMAL HIGH (ref 4.0–10.5)
nRBC: 0.2 % (ref 0.0–0.2)

## 2019-12-19 LAB — PHOSPHORUS: Phosphorus: 2.4 mg/dL — ABNORMAL LOW (ref 2.5–4.6)

## 2019-12-19 LAB — MAGNESIUM: Magnesium: 2.2 mg/dL (ref 1.7–2.4)

## 2019-12-19 LAB — C-REACTIVE PROTEIN: CRP: 15.4 mg/dL — ABNORMAL HIGH (ref <1.0)

## 2019-12-19 LAB — TRIGLYCERIDES: Triglycerides: 142 mg/dL (ref <150)

## 2019-12-19 LAB — FIBRIN DERIVATIVES D-DIMER (ARMC ONLY): Fibrin derivatives D-dimer (ARMC): >7500 ng/mL (FEU) — ABNORMAL HIGH (ref 0.00–499.00)

## 2019-12-19 MED ORDER — VASOPRESSIN 20 UNITS/100 ML INFUSION FOR SHOCK
0.0000 [IU]/min | INTRAVENOUS | Status: DC
  Administered 2019-12-19: 0.03 [IU]/min via INTRAVENOUS
  Filled 2019-12-19 (×2): qty 100

## 2019-12-19 MED ORDER — VANCOMYCIN HCL 500 MG/100ML IV SOLN
500.0000 mg | Freq: Two times a day (BID) | INTRAVENOUS | Status: DC
  Filled 2019-12-19 (×2): qty 100

## 2019-12-19 MED ORDER — VITAL AF 1.2 CAL PO LIQD
1000.0000 mL | ORAL | Status: DC
  Administered 2019-12-19: 1000 mL

## 2019-12-19 MED ORDER — POTASSIUM CHLORIDE 20 MEQ PO PACK
40.0000 meq | PACK | Freq: Once | ORAL | Status: AC
  Administered 2019-12-19: 40 meq
  Filled 2019-12-19: qty 2

## 2019-12-19 MED ORDER — PHENYLEPHRINE CONCENTRATED 100MG/250ML (0.4 MG/ML) INFUSION SIMPLE
0.0000 ug/min | INTRAVENOUS | Status: DC
  Administered 2019-12-19: 40 ug/min via INTRAVENOUS
  Administered 2019-12-20: 400 ug/min via INTRAVENOUS
  Filled 2019-12-19 (×2): qty 250

## 2019-12-19 MED ORDER — INSULIN ASPART 100 UNIT/ML ~~LOC~~ SOLN
4.0000 [IU] | SUBCUTANEOUS | Status: DC
  Administered 2019-12-19 – 2019-12-20 (×3): 4 [IU] via SUBCUTANEOUS
  Filled 2019-12-19 (×3): qty 1

## 2019-12-19 MED ORDER — NOREPINEPHRINE 16 MG/250ML-% IV SOLN
0.0000 ug/min | INTRAVENOUS | Status: DC
  Administered 2019-12-20: 40 ug/min via INTRAVENOUS
  Filled 2019-12-19 (×2): qty 250

## 2019-12-19 MED ORDER — INSULIN GLARGINE 100 UNIT/ML ~~LOC~~ SOLN
15.0000 [IU] | Freq: Two times a day (BID) | SUBCUTANEOUS | Status: DC
  Administered 2019-12-19: 15 [IU] via SUBCUTANEOUS
  Filled 2019-12-19 (×3): qty 0.15

## 2019-12-19 MED ORDER — PHENYLEPHRINE HCL-NACL 10-0.9 MG/250ML-% IV SOLN
0.0000 ug/min | INTRAVENOUS | Status: DC
  Administered 2019-12-19: 25 ug/min via INTRAVENOUS
  Filled 2019-12-19: qty 250

## 2019-12-19 MED ORDER — SODIUM CHLORIDE 0.9 % IV SOLN
2.0000 g | Freq: Two times a day (BID) | INTRAVENOUS | Status: DC
  Administered 2019-12-19: 2 g via INTRAVENOUS
  Filled 2019-12-19 (×3): qty 2

## 2019-12-19 MED ORDER — HYDROCORTISONE NA SUCCINATE PF 100 MG IJ SOLR
50.0000 mg | Freq: Four times a day (QID) | INTRAMUSCULAR | Status: DC
  Administered 2019-12-19: 50 mg via INTRAVENOUS
  Filled 2019-12-19: qty 2

## 2019-12-19 MED ORDER — VANCOMYCIN HCL 1250 MG/250ML IV SOLN
1250.0000 mg | Freq: Once | INTRAVENOUS | Status: AC
  Administered 2019-12-19: 1250 mg via INTRAVENOUS
  Filled 2019-12-19: qty 250

## 2019-12-19 MED ORDER — VECURONIUM BROMIDE 10 MG IV SOLR
10.0000 mg | Freq: Once | INTRAVENOUS | Status: AC
  Administered 2019-12-19: 10 mg via INTRAVENOUS

## 2019-12-19 NOTE — Progress Notes (Signed)
GOALS OF CARE DISCUSSION  The Clinical status was relayed to family in detail.  Updated and notified of patients medical condition.  patient with increased WOB and using accessory muscles to breathe Explained to family course of therapy and the modalities     Patient with resp  failure with very low chance of meaningful recovery despite all aggressive and optimal medical therapy. Patient is in the Dying  Process associated with Suffering.  Family understands the situation.  Remains FULL CODE   Family are satisfied with Plan of action and management. All questions answered  Additional CC time 32 mins   Sandra Tellefsen Santiago Glad, M.D.  [facility]  Medical Director [facility] Medical Director [facility] Cardio-Pulmonary Department

## 2019-12-19 NOTE — Progress Notes (Signed)
Inpatient Diabetes Program Recommendations  AACE/ADA: New Consensus Statement on Inpatient Glycemic Control (2015)  Target Ranges:  Prepandial:   less than 140 mg/dL      Peak postprandial:   less than 180 mg/dL (1-2 hours)      Critically ill patients:  140 - 180 mg/dL   Results for [facility], [place] (MRN [id number]) as of 12/19/2019 07:35  Ref. Range 12/17/2019 23:09 12/18/2019 04:24 12/18/2019 07:35 12/18/2019 11:23 12/18/2019 15:37 12/18/2019 19:59  Glucose-Capillary Latest Ref Range: 70 - 99 mg/dL 914 (H)  7 units NOVOLOG  269 (H)  11 units NOVOLOG  296 (H)  11 units NOVOLOG  193 (H)  4 units NOVOLOG +  10 units LANTUS  187 (H)  4 units NOVOLOG  197 (H)  4 units NOVOLOG +  10 units LANTUS    Results for [facility], Tinia (MRN [id number]) as of 12/19/2019 07:35  Ref. Range 12/19/2019 00:05 12/19/2019 04:16  Glucose-Capillary Latest Ref Range: 70 - 99 mg/dL 086 (H)  7 units NOVOLOG  286 (H)  11 units NOVOLOG     Home DM Meds: Lantus 40 units QHS                             Novolog 16 units daily before breakfast  Current Orders: Lantus 10 units BID                            Novolog Resistant Correction Scale/ SSI (0-20 units) Q4 hours     MD- May want to consider adding Novolog Tube Feed Coverage to current Insulin regimen:  Novolog 4 units Q4 hours  HOLD if tube feeds HELD for any reason  May also consider increasing the Levemir to 15 units BID    --Will follow patient during hospitalization--  Ambrose Finland RN, MSN, CDE Diabetes Coordinator Inpatient Glycemic Control Team Team Pager: [phone] (8a-5p)

## 2019-12-19 NOTE — Progress Notes (Signed)
Per [name], np, increased fio2 to 100% and peep to 12

## 2019-12-19 NOTE — Progress Notes (Signed)
O2 sat is very low, blood pressure is decreased as well, have increased neosynephrine to maxium dose with little to no improvement. Have increased fi02 to 100 and peep to 12, O2 sat remains low. Awaiting Vasopressin from pharmacy.

## 2019-12-19 NOTE — Progress Notes (Signed)
Pharmacy Antibiotic Note  Alexis Daugherty is a 64 y.o. female admitted on 12/28/2019 with acute hypoxemic respiratory failure due to COVID-19 PNA. Patient intubated 10/19. Patient completed remdesivir 10/22. Patient also completed a 5 day course of ceftriaxone and azithromycin.  Pharmacy has been consulted for Vancomycin and Cefepime dosing.  10/18 CXR shows bilateral multifocal PNA 10/22 CXR shows progressive diffuse pulmonary infiltrate, likely infectious or inflammatory.  Plan: Start Vancomycin 1250mg  IV x1 for loading dose, followed by Vancomycin 500mg  IV q12h per dosing nomogram Obtain vanc level prior to 4th or 5th dose  Start Cefepime 2g IV q12h  Monitor renal function and adjust dose as clinically indicated  Height: 5\' 1"  (154.9 cm) Weight: 55.6 kg (122 lb 9.2 oz) IBW/kg (Calculated) : 47.8  Temp (24hrs), Avg:100.6 F (38.1 C), Min:99.5 F (37.5 C), Max:101.8 F (38.8 C)  Recent Labs  Lab 12/11/2019 0803 12/08/2019 0807 12/19/2019 1039 12/16/19 0443 12/17/19 0504 12/18/19 0449 12/19/19 0432  WBC 12.8*  --   --  14.1* 23.2* 24.5* 12.8*  CREATININE 0.85  --   --  0.60 0.86 1.02* 0.85  LATICACIDVEN  --  3.4* 1.7  --   --   --   --     Estimated Creatinine Clearance: 49.8 mL/min (by C-G formula based on SCr of 0.85 mg/dL).    No Known Allergies  Antimicrobials this admission: 10/18 azithromycin >> 10/22 10/18 ceftriaxone >> 10/22 10/22 cefepime >> 10/22 vancomycin >>  Microbiology results: 10/18 BCx: NGTD 10/18 UCx: no growth 10/19 MRSA PCR: negative  Thank you for allowing pharmacy to be a part of this patient's care.   11/18, PharmD Pharmacy Resident  12/19/2019 5:50 PM

## 2019-12-19 NOTE — Progress Notes (Addendum)
Pt noted to have severe hypoxia, with O2 sats ranging from 50 to 70%.  Pt PEEP increased to 12, and FiO2 to 100%.  Given additional vecuronium.  CXR obtained which showed Slight interval worsening of bilateral airspace disease compared to prior, but ETT in acceptable position and no PTX.  Orders placed for recruitment maneuvers.  Called Pharmacy to inquire about inhaled NO or inhaled Prostacyclin.  Per Pharmacy we do not have either medication.   Discussed with Dr. Belia Heman for additional recommendations.  He is in agreement with current interventions, and recommends unproning the pt and calling and updating pt's family and having them come to the unit to see the pt from the door.  Will call pt's family.     [name], AGACNP-BC [place] [facility] Pager: [phone]

## 2019-12-19 NOTE — Progress Notes (Signed)
CRITICAL CARE NOTE  64yo female admitted 10/18 after being sick for 2 weeks w/difficulty breathing, fever, dry cough and poor intake and found to have COVID Pneumonia. Patient is unvaccinated. COVID PCR +10/18. Intubated 10/19.   10/19 progressive resp failure transferred to ICU and emergently intubated 10/20 Received Vec x1 overnight, weaning FIO2, GI prophy added, Tube feeds started, Increase insulin coverage, steroids discontinued 10/22 severe resp failure  CC  follow up respiratory failure  SUBJECTIVE Patient remains critically ill Prognosis is guarded   BP 92/67   Pulse (!) 118   Temp 100.2 F (37.9 C) (Esophageal)   Resp (!) 24   Ht _0  (1.549 m)   Wt 55.6 kg   SpO2 94%   BMI 23.16 kg/m    I/O last 3 completed shifts: In: 5180.8 [I.V.:2177.2; Other:1600; NG/GT:1154.1; IV Piggyback:249.5] Out: 810 [Urine:810] No intake/output data recorded.  SpO2: 94 % O2 Flow Rate (L/min): 55 L/min FiO2 (%): 45 %  Estimated body mass index is 23.16 kg/m as calculated from the following:   Height as of this encounter: _1  (1.549 m).   Weight as of this encounter: 55.6 kg.  SIGNIFICANT EVENTS   REVIEW OF SYSTEMS  PATIENT IS UNABLE TO PROVIDE COMPLETE REVIEW OF SYSTEMS DUE TO SEVERE CRITICAL ILLNESS      COVID-19 DISASTER DECLARATION:   FULL CONTACT PHYSICAL EXAMINATION WAS NOT POSSIBLE DUE TO TREATMENT OF COVID-19  AND CONSERVATION OF PERSONAL PROTECTIVE EQUIPMENT, LIMITED EXAM FINDINGS INCLUDE-   PHYSICAL EXAMINATION:  GENERAL:critically ill appearing, +resp distress NEUROLOGIC: obtunded, GCS<8   Patient assessed or the symptoms described in the history of present illness.  In the context of the Global COVID-19 pandemic, which necessitated consideration that the patient might be at risk for infection with the SARS-CoV-2 virus that causes COVID-19, Institutional protocols and algorithms that pertain to the evaluation of patients at risk for COVID-19 are in a  state of rapid change based on information released by regulatory bodies including the CDC and federal and state organizations. These policies and algorithms were followed during the patient's care while in hospital.    MEDICATIONS: I have reviewed all medications and confirmed regimen as documented   CULTURE RESULTS   Recent Results (from the past 240 hour(s))  Blood Culture (routine x 2)     Status: None (Preliminary result)   Collection Time: 12/05/2019  8:03 AM   Specimen: BLOOD  Result Value Ref Range Status   Specimen Description BLOOD LEFT ANTECUBITAL  Final   Special Requests   Final    BOTTLES DRAWN AEROBIC AND ANAEROBIC Blood Culture adequate volume   Culture   Final    NO GROWTH 4 DAYS Performed at [facility], [address] [place], [place] [address]    Report Status PENDING  Incomplete  Urine culture     Status: None   Collection Time: 11/28/2019  8:03 AM   Specimen: In/Out Cath Urine  Result Value Ref Range Status   Specimen Description   Final    IN/OUT CATH URINE Performed at [facility], [address] [place], [place] [address]    Special Requests   Final    NONE Performed at [facility], [address], [place], [place] [address]    Culture   Final    NO GROWTH Performed at [facility], [place] [address] [place], [place] [address]    Report Status 12/17/2019 FINAL  Final  Blood Culture (routine x 2)  Status: None (Preliminary result)   Collection Time: 12/23/2019  8:07 AM   Specimen: BLOOD  Result Value Ref Range Status   Specimen Description BLOOD BLOOD LEFT FOREARM  Final   Special Requests   Final    BOTTLES DRAWN AEROBIC AND ANAEROBIC Blood Culture adequate volume   Culture   Final    NO GROWTH 4 DAYS Performed at [facility], [address], [place], [place] [address]    Report Status PENDING  Incomplete  Resp Panel by RT PCR (RSV, Flu A&B, Covid) -     Status: None   Collection Time: 12/09/2019   8:52 AM  Result Value Ref Range Status   SARS Coronavirus 2 by RT PCR NEGATIVE NEGATIVE Final    Comment: (NOTE) SARS-CoV-2 target nucleic acids are NOT DETECTED.  The SARS-CoV-2 RNA is generally detectable in upper respiratoy specimens during the acute phase of infection. The lowest concentration of SARS-CoV-2 viral copies this assay can detect is 131 copies/mL. A negative result does not preclude SARS-Cov-2 infection and should not be used as the sole basis for treatment or other patient management decisions. A negative result may occur with  improper specimen collection/handling, submission of specimen other than nasopharyngeal swab, presence of viral mutation(s) within the areas targeted by this assay, and inadequate number of viral copies (<131 copies/mL). A negative result must be combined with clinical observations, patient history, and epidemiological information. The expected result is Negative.  Fact Sheet for Patients:  PinkCheek.be  Fact Sheet for Healthcare Providers:  GravelBags.it  This test is no t yet approved or cleared by the [place] FDA and  has been authorized for detection and/or diagnosis of SARS-CoV-2 by FDA under an Emergency Use Authorization (EUA). This EUA will remain  in effect (meaning this test can be used) for the duration of the COVID-19 declaration under Section 564(b)(1) of the Act, 21 U.S.C. section 360bbb-3(b)(1), unless the authorization is terminated or revoked sooner.     Influenza A by PCR NEGATIVE NEGATIVE Final   Influenza B by PCR NEGATIVE NEGATIVE Final    Comment: (NOTE) The Xpert Xpress SARS-CoV-2/FLU/RSV assay is intended as an aid in  the diagnosis of influenza from Nasopharyngeal swab specimens and  should not be used as a sole basis for treatment. Nasal washings and  aspirates are unacceptable for Xpert Xpress SARS-CoV-2/FLU/RSV  testing.  Fact Sheet for  Patients: PinkCheek.be  Fact Sheet for Healthcare Providers: GravelBags.it  This test is not yet approved or cleared by the [place] FDA and  has been authorized for detection and/or diagnosis of SARS-CoV-2 by  FDA under an Emergency Use Authorization (EUA). This EUA will remain  in effect (meaning this test can be used) for the duration of the  Covid-19 declaration under Section 564(b)(1) of the Act, 21  U.S.C. section 360bbb-3(b)(1), unless the authorization is  terminated or revoked.    Respiratory Syncytial Virus by PCR NEGATIVE NEGATIVE Final    Comment: (NOTE) Fact Sheet for Patients: PinkCheek.be  Fact Sheet for Healthcare Providers: GravelBags.it  This test is not yet approved or cleared by the [place] FDA and  has been authorized for detection and/or diagnosis of SARS-CoV-2 by  FDA under an Emergency Use Authorization (EUA). This EUA will remain  in effect (meaning this test can be used) for the duration of the  COVID-19 declaration under Section 564(b)(1) of the Act, 21 U.S.C.  section 360bbb-3(b)(1), unless the authorization is terminated or  revoked. Performed at [facility], [place], [place] [address]   Respiratory Panel by RT PCR (Flu A&B, Covid) -     Status: Abnormal   Collection Time: 12/12/2019 12:55 PM  Result Value Ref Range Status   SARS Coronavirus 2 by RT PCR POSITIVE (A) NEGATIVE Final    Comment: RESULT CALLED TO, READ BACK BY AND VERIFIED WITH: DAVID, RACUEL AT 2009 ON 12/13/2019 BY SS (NOTE) SARS-CoV-2 target nucleic acids are DETECTED.  SARS-CoV-2 RNA is generally detectable in upper respiratory specimens  during the acute phase of infection. Positive results are indicative of the presence of the identified virus, but do not rule out bacterial infection or co-infection with other pathogens  not detected by the test. Clinical correlation with patient history and other diagnostic information is necessary to determine patient infection status. The expected result is Negative.  Fact Sheet for Patients:  PinkCheek.be  Fact Sheet for Healthcare Providers: GravelBags.it  This test is not yet approved or cleared by the [place] FDA and  has been authorized for detection and/or diagnosis of SARS-CoV-2 by FDA under an Emergency Use Authorization (EUA).  This EUA will remain in effect (meaning this test ca n be used) for the duration of  the COVID-19 declaration under Section 564(b)(1) of the Act, 21 U.S.C. section 360bbb-3(b)(1), unless the authorization is terminated or revoked sooner.      Influenza A by PCR NEGATIVE NEGATIVE Final   Influenza B by PCR NEGATIVE NEGATIVE Final    Comment: (NOTE) The Xpert Xpress SARS-CoV-2/FLU/RSV assay is intended as an aid in  the diagnosis of influenza from Nasopharyngeal swab specimens and  should not be used as a sole basis for treatment. Nasal washings and  aspirates are unacceptable for Xpert Xpress SARS-CoV-2/FLU/RSV  testing.  Fact Sheet for Patients: PinkCheek.be  Fact Sheet for Healthcare Providers: GravelBags.it  This test is not yet approved or cleared by the [place] FDA and  has been authorized for detection and/or diagnosis of SARS-CoV-2 by  FDA under an Emergency Use Authorization (EUA). This EUA will remain  in effect (meaning this test can be used) for the duration of the  Covid-19 declaration under Section 564(b)(1) of the Act, 21  U.S.C. section 360bbb-3(b)(1), unless the authorization is  terminated or revoked. Performed at [facility], [address] [place], [place] [address]   MRSA PCR Screening     Status: None   Collection Time: 12/16/19  8:41 PM   Specimen:  Nasopharyngeal  Result Value Ref Range Status   MRSA by PCR NEGATIVE NEGATIVE Final    Comment:        The GeneXpert MRSA Assay (FDA approved for NASAL specimens only), is one component of a comprehensive MRSA colonization surveillance program. It is not intended to diagnose MRSA infection nor to guide or monitor treatment for MRSA infections. Performed at [facility], [address] [place], [place] [address]   Respiratory Panel by RT PCR (Flu A&B, Covid) - Nasopharyngeal Swab     Status: Abnormal   Collection Time: 12/17/19 10:37 AM   Specimen: Nasopharyngeal Swab  Result Value Ref Range Status   SARS Coronavirus 2 by RT PCR POSITIVE (A) NEGATIVE Final    Comment: RESULT CALLED TO, READ BACK BY AND VERIFIED WITH: BROOKE DARNELL 12/17/19 1135 KLW (NOTE) SARS-CoV-2 target nucleic acids are DETECTED.  SARS-CoV-2 RNA is generally detectable in upper respiratory specimens  during the acute phase of infection. Positive results are indicative of the presence of the  identified virus, but do not rule out bacterial infection or co-infection with other pathogens not detected by the test. Clinical correlation with patient history and other diagnostic information is necessary to determine patient infection status. The expected result is Negative.  Fact Sheet for Patients:  PinkCheek.be  Fact Sheet for Healthcare Providers: GravelBags.it  This test is not yet approved or cleared by the [place] FDA and  has been authorized for detection and/or diagnosis of SARS-CoV-2 by FDA under an Emergency Use Authorization (EUA).  This EUA will remain in effect (meaning this test can be [place] ed) for the duration of  the COVID-19 declaration under Section 564(b)(1) of the Act, 21 U.S.C. section 360bbb-3(b)(1), unless the authorization is terminated or revoked sooner.      Influenza A by PCR NEGATIVE NEGATIVE Final    Influenza B by PCR NEGATIVE NEGATIVE Final    Comment: (NOTE) The Xpert Xpress SARS-CoV-2/FLU/RSV assay is intended as an aid in  the diagnosis of influenza from Nasopharyngeal swab specimens and  should not be used as a sole basis for treatment. Nasal washings and  aspirates are unacceptable for Xpert Xpress SARS-CoV-2/FLU/RSV  testing.  Fact Sheet for Patients: PinkCheek.be  Fact Sheet for Healthcare Providers: GravelBags.it  This test is not yet approved or cleared by the [place] FDA and  has been authorized for detection and/or diagnosis of SARS-CoV-2 by  FDA under an Emergency Use Authorization (EUA). This EUA will remain  in effect (meaning this test can be used) for the duration of the  Covid-19 declaration under Section 564(b)(1) of the Act, 21  U.S.C. section 360bbb-3(b)(1), unless the authorization is  terminated or revoked. Performed at [facility], [address] [place], [place] [address]           IMAGING    [place] EKG SITE RITE  Result Date: 12/18/2019 If [facility] image not attached, placement could not be confirmed due to current cardiac rhythm.    Nutrition Status: Nutrition Problem: Inadequate oral intake Etiology: inability to eat Signs/Symptoms: NPO status Interventions: Tube feeding   CMP Latest Ref Rng & Units 12/19/2019 12/18/2019 12/17/2019  Glucose 70 - 99 mg/dL 299(H) 314(H) 309(H)  BUN 8 - 23 mg/dL 60(H) 59(H) 34(H)  Creatinine 0.44 - 1.00 mg/dL 0.85 1.02(H) 0.86  Sodium 135 - 145 mmol/L 142 138 136  Potassium 3.5 - 5.1 mmol/L 3.4(L) 4.5 4.0  Chloride 98 - 111 mmol/L 113(H) 107 105  CO2 22 - 32 mmol/L 21(L) 21(L) 20(L)  Calcium 8.9 - 10.3 mg/dL 7.0(L) 8.6(L) 8.8(L)  Total Protein 6.5 - 8.1 g/dL 5.2(L) 6.4(L) 6.5  Total Bilirubin 0.3 - 1.2 mg/dL 0.8 1.1 1.1  Alkaline Phos 38 - 126 U/L 75 111 109  AST 15 - 41 U/L 33 24 26  ALT 0 - 44 U/L _0 Indwelling Urinary Catheter continued, requirement due to   Reason to continue Indwelling Urinary Catheter strict Intake/Output monitoring for hemodynamic instability   Central Line/ continued, requirement due to  Reason to continue [place] of central venous pressure or other hemodynamic parameters and poor IV access   Ventilator continued, requirement due to severe respiratory failure   Ventilator Sedation RASS 0 to -2      ASSESSMENT AND PLAN SYNOPSIS  Acute hypoxemic respiratory failure due to COVID-19 pneumonia / ARDS Mechanical ventilation via ARDS protocol, target PRVC 6 cc/kg Wean PEEP and FiO2 as able Goal plateau pressure less than 30, driving  pressure less than 15 Paralytics if necessary for vent synchrony, gas exchange Cycle prone positioning if necessary for oxygenation Deep sedation per PAD protocol, goal RASS -4, currently fentanyl, midazolam Diuresis as blood pressure and renal function can tolerate, diuresis as tolerated based on Kidney function VAP prevention order set Remdesivir  IV STEROIDS   Severe ACUTE Hypoxic and Hypercapnic Respiratory Failure -continue Full MV support -continue Bronchodilator Therapy -Wean Fio2 and PEEP as tolerated -will perform SAT/SBT when respiratory parameters are met -VAP/VENT bundle implementation   NEUROLOGY - intubated and sedated - minimal sedation to achieve a RASS goal: -1 Wake up assessment pending  SHOCK-SEPSIS-present on amdission -use vasopressors to keep MAP>65  CARDIAC ICU monitoring  ID -continue IV abx as prescibed -follow up cultures  GI GI PROPHYLAXIS as indicated   DIET-->TF's as tolerated Constipation protocol as indicated  ENDO - will use ICU hypoglycemic\Hyperglycemia protocol if indicated     ELECTROLYTES -follow labs as needed -replace as needed -pharmacy consultation and following   DVT/GI PRX ordered and assessed TRANSFUSIONS AS NEEDED MONITOR FSBS I  Assessed the need for Labs I Assessed the need for Foley I Assessed the need for Central Venous Line Family Discussion when available I Assessed the need for Mobilization I made an Assessment of medications to be adjusted accordingly Safety Risk assessment completed   CASE DISCUSSED IN MULTIDISCIPLINARY ROUNDS WITH ICU TEAM  Critical Care Time devoted to patient care services described in this note is 75 minutes.   Overall, patient is critically ill, prognosis is guarded.  Patient with Multiorgan failure and at high risk for cardiac arrest and death.    Corrin Parker, M.D.  [facility]  Medical Director [place] Director [facility] Cardio-Pulmonary Department

## 2019-12-20 DIAGNOSIS — A419 Sepsis, unspecified organism: Secondary | ICD-10-CM

## 2019-12-20 DIAGNOSIS — R6520 Severe sepsis without septic shock: Secondary | ICD-10-CM | POA: Diagnosis not present

## 2019-12-20 DIAGNOSIS — E876 Hypokalemia: Secondary | ICD-10-CM | POA: Diagnosis not present

## 2019-12-20 DIAGNOSIS — A4189 Other specified sepsis: Secondary | ICD-10-CM | POA: Diagnosis not present

## 2019-12-20 DIAGNOSIS — J9601 Acute respiratory failure with hypoxia: Secondary | ICD-10-CM | POA: Diagnosis not present

## 2019-12-20 LAB — BLOOD GAS, ARTERIAL
Acid-base deficit: 16.4 mmol/L — ABNORMAL HIGH (ref 0.0–2.0)
Bicarbonate: 17 mmol/L — ABNORMAL LOW (ref 20.0–28.0)
FIO2: 100
MECHVT: 420 mL
Mechanical Rate: 24
O2 Saturation: 89.9 %
PEEP: 20 cmH2O
Patient temperature: 37
pCO2 arterial: 83 mmHg (ref 32.0–48.0)
pH, Arterial: 6.92 — CL (ref 7.350–7.450)
pO2, Arterial: 94 mmHg (ref 83.0–108.0)

## 2019-12-20 LAB — CULTURE, BLOOD (ROUTINE X 2)
Culture: NO GROWTH
Culture: NO GROWTH
Special Requests: ADEQUATE
Special Requests: ADEQUATE

## 2019-12-20 LAB — GLUCOSE, CAPILLARY: Glucose-Capillary: 229 mg/dL — ABNORMAL HIGH (ref 70–99)

## 2019-12-20 MED ORDER — EPINEPHRINE HCL 5 MG/250ML IV SOLN IN NS
0.5000 ug/min | INTRAVENOUS | Status: DC
  Administered 2019-12-20: 0.5 ug/min via INTRAVENOUS
  Filled 2019-12-20: qty 250

## 2019-12-20 MED ORDER — SODIUM BICARBONATE 8.4 % IV SOLN: 100.0000 meq | Freq: Once | INTRAVENOUS | Status: AC

## 2019-12-20 MED ORDER — STERILE WATER FOR INJECTION IV SOLN
INTRAVENOUS | Status: DC
  Filled 2019-12-20: qty 850
  Filled 2019-12-20: qty 150
  Filled 2019-12-20: qty 850

## 2019-12-20 MED ORDER — AMIODARONE IV BOLUS ONLY 150 MG/100ML
INTRAVENOUS | Status: AC
  Administered 2019-12-20: 150 mg via INTRAVENOUS
  Filled 2019-12-20: qty 100

## 2019-12-20 MED ORDER — AMIODARONE HCL IN DEXTROSE 360-4.14 MG/200ML-% IV SOLN: 60.0000 mg/h | INTRAVENOUS | Status: DC

## 2019-12-20 MED ORDER — AMIODARONE HCL IN DEXTROSE 360-4.14 MG/200ML-% IV SOLN: 30.0000 mg/h | INTRAVENOUS | Status: DC

## 2019-12-20 MED ORDER — AMIODARONE IV BOLUS ONLY 150 MG/100ML: 150.0000 mg | Freq: Once | INTRAVENOUS | Status: AC

## 2019-12-20 MED ORDER — SODIUM BICARBONATE 8.4 % IV SOLN
INTRAVENOUS | Status: AC
  Administered 2019-12-20: 100 meq via INTRAVENOUS
  Filled 2019-12-20: qty 100

## 2019-12-22 MED FILL — Sodium Chloride IV Soln 0.9%: INTRAVENOUS | Qty: 250 | Status: AC

## 2019-12-22 MED FILL — Phenylephrine HCl IV Soln 10 MG/ML: INTRAVENOUS | Qty: 10 | Status: AC

## 2019-12-29 NOTE — Significant Event (Signed)
CODE BLUE DOCUMENTATION  03:30 Pt with bradycardia which progressed to V-tach, PEA, and asystole.  CPR and ACLS begun immediately.  Pt received 1 amp Epi and 1 amp Bicarb.  Upon rhythm check, ROSC obtained at 03:33 (CODE 3 minutes in duration).   03:38 Pt again with bradycardia which progressed to PEA and then Asystole.  Myself and RN at bedside during event of which ACLS and CPR begun immediately.  She received 1 amp epi, 1 Calcium.  Upon 1st rhythm check, ROSC obtained of 03:41 (CODE 3 minutes in duration)    Pt now with fixed and dilated pupils.  Concern for anoxic injury.  Requiring multiple vasopressors.  Very poor prognosis.    Have called to update pt's daughter Pauline Good, but no answer.  Message left for urgent callback.   [name], AGACNP-BC [facility] Pager: [phone]

## 2019-12-29 NOTE — Progress Notes (Signed)
Have called and updated pt's daughter and son and discussed her declining status, and concern for possible cardiac arrest due to severe hypoxia.  Encouraged them to come to bedside as soon as possible, which they will do. We discussed CODE status, and they request that she continue to be a Full Code for now.  Pt's son asked about ECMO.  I called and discussed with Dr. Warrick Parisian, who discussed with Dr. Cleon Gustin as [facility], and it was determined she is not a candidate for ECMO.  Awaiting family arrival.  She is critically ill and at high risk for cardiac arrest and death.  Prognosis is very poor.       [name], AGACNP-BC [facility] Pager: [phone]

## 2019-12-29 NOTE — Procedures (Addendum)
Arterial Catheter Insertion Procedure Note  Alexis Daugherty  [id number]  1955-10-22  Date:12/12/2019  Time:2:37 AM    Provider Performing: Judithe Modest    Procedure: Insertion of Arterial Line (47425) with US guidance (95638)   Indication(s) Blood pressure monitoring and/or need for frequent ABGs  Consent Unable to obtain consent due to emergent nature of procedure.  Anesthesia None   Time Out Verified patient identification, verified procedure, site/side was marked, verified correct patient position, special equipment/implants available, medications/allergies/relevant history reviewed, required imaging and test results available.   Sterile Technique Maximal sterile technique including full sterile barrier drape, hand hygiene, sterile gown, sterile gloves, mask, hair covering, sterile ultrasound probe cover (if used).   Procedure Description Area of catheter insertion was cleaned with chlorhexidine and draped in sterile fashion. With real-time ultrasound guidance an arterial catheter was placed into the left femoral artery.  Appropriate arterial tracings confirmed on monitor.     Complications/Tolerance None; patient tolerated the procedure well.   EBL Minimal   Specimen(s) None  BIOPATCH applied to the insertion site.   [name], AGACNP-BC [facility] Pager: [phone]

## 2019-12-29 NOTE — Progress Notes (Signed)
Rate increased to 28, per NP,[place]

## 2019-12-29 NOTE — Progress Notes (Signed)
Chaplain followed up with family after Alexis Daugherty coded and was not able to be revived.  Chaplain offered support and the ministries of presence.  Chaplain served a Print production planner between family and staff as they visited Alexis Daugherty outside of her room, escorting them two at a time. Chaplain provided family with the nursing supervisor's number because they did not know about funeral information at this time.  Family also was given [employer] phone and charger and the Bible they took in the room earlier.    Family continued to share memories of Alexis Daugherty with chaplain.  Her grandson could recall his grandmother always having a lot of faith in God since he's been born and that she always had a smile on her face.  They also shared that Alexis Daugherty had begun having dreams about a year ago about God taking her to really beautiful place.  Family worked to [employer] death as a part of God's plan and will and to make meaning of her passing.   Chaplain is available to provide support as needed.     January 06, 2020 0600  Clinical Encounter Type  Visited With Family  Visit Type Death  Spiritual Encounters  Spiritual Needs Emotional;Grief support

## 2019-12-29 NOTE — Progress Notes (Signed)
Peep increased to 20 per NP, Elvina Sidle

## 2019-12-29 NOTE — Progress Notes (Signed)
Called [facility] now [address], spoke with Dutch Quint, organ donation coordinator. Patient has been ruled out for all donations reference number: [phone]

## 2019-12-29 NOTE — Death Summary Note (Addendum)
DEATH SUMMARY   Patient Details  Name: Alexis Daugherty MRN: [id number] DOB: 29-Aug-1955  Admission/Discharge Information   Admit Date:  07-Jan-2020  Date of Death:  01/12/2020  Time of Death:  04:07  Length of Stay: 5  Referring Physician: Department, [facility]) for Hospitalization  COVID-19 Pneumonia Acute Hypoxic Respiratory Failure ARDS Septic Shock Metabolic Acidosis Acute Toxic Metabolic Encephalopathy  Diagnoses  Preliminary cause of death:   COVID-19 Pneumonia Secondary Diagnoses (including complications and co-morbidities):  Principal Problem:   Sepsis (HCC) Active Problems:   CAP (community acquired pneumonia)   Acute respiratory failure (HCC)   Diabetes mellitus (HCC)   Suspected COVID-19 virus infection   Lactic acid acidosis   COVID-19 virus infection   Goals of care, counseling/discussion   Palliative care by specialist   Brief Hospital Course (including significant findings, care, treatment, and services provided and events leading to death)   Alexis Daugherty is a 64 y.o. female with medical history significant for diabetes mellitus who was brought in by EMS on Jan 07, 2020 for evaluation of fever and shortness of breath.  Per patient's daughter she has been sick for about 2 weeks but got worse in the last 24 hours before her admission with increasing weakness and shortness of breath.  She has had subjective fevers at home as well as a dry cough.  Her oral intake is poor but she denies having any nausea, vomiting, abdominal pain or any changes in her bowel habits. She has no chest pain, diaphoresis or palpitations.  She does complain of dizziness and weakness. Patient is unvaccinated against the COVID-19 virus.  Per EMS patient had a T-max of 100.9, room air pulse oximetry of 75% requiring nonrebreather mask with improvement in her pulse oximetry to 90%. Chest x-ray  showed bilateral multifocal pneumonia. CT angiogram of the chest shows extensive  bilateral airspace infiltrate in keeping with acute infection inflammation.  The findings are typical of that seen with COVID-19 pneumonia.  Extensive parenchymal involvement.  Her SARS-CoV-2 PCR was initially negative, but upon recheck was found to be positive later in the day.  She was admitted by the Hospitalist for Acute Hypoxic Respiratory Failure in the setting of COVID-19 Pneumonia.  Infectious Disease and Pulmonology were consulted.  On 12/16/19 she developed progressive respiratory failure and ARDS requiring transfer to ICU and emergent intubation.  She remained critically ill with severe ARDS and septic shock.  On 12/19/19 she developed refractory hypoxia despite max vent support, along with severe hypotension and metabolic acidosis requiring 3 vasopressors, stress dose steroids, and bicarb drip.  Pt's family was brought to bedside to discuss her critical status, poor prognosis, and high likelihood of cardiac arrest.  Pt's family wanted to continue all aggressive medical measures and Full Code status.   Early in the morning on 2020/01/12 at approximately 03:30  she suffered 3 cardiac arrests within 30 minutes.  During the 3rd cardiac arrest, unable to obtain ROSC.  Time of death called at 04:07 on Jan 12, 2020.       Pertinent Labs and Studies  Significant Diagnostic Studies DG Chest 1 View  Result Date: 2020-01-07 CLINICAL DATA:  Altered mental status.  Fever. EXAM: CHEST  1 VIEW COMPARISON:  November 17, 2016. FINDINGS: Stable cardiomediastinal silhouette. No pneumothorax or pleural effusion is noted. Multiple airspace opacities are noted throughout both lungs most consistent with multifocal pneumonia. Bony thorax is unremarkable. IMPRESSION: Bilateral multifocal pneumonia. Electronically Signed   By: Lupita Raider M.D.   On:  12/11/2019 08:19   DG Abd 1 View  Result Date: 12/16/2019 CLINICAL DATA:  OG tube placement EXAM: ABDOMEN - 1 VIEW COMPARISON:  None. FINDINGS: OG tube tip is in  the stomach.  Nonobstructive bowel gas pattern. IMPRESSION: OG tube tip in the stomach. Electronically Signed   By: Charlett Nose M.D.   On: 12/16/2019 20:41   CT Head Wo Contrast  Result Date: 12/11/2019 CLINICAL DATA:  Altered mental status EXAM: CT HEAD WITHOUT CONTRAST TECHNIQUE: Contiguous axial images were obtained from the base of the skull through the vertex without intravenous contrast. COMPARISON:  None. FINDINGS: Brain: Normal anatomic configuration. No abnormal intra or extra-axial mass lesion or fluid collection. No abnormal mass effect or midline shift. Remote lacunar infarct noted within the right basal ganglia. No evidence of acute intracranial hemorrhage or infarct. Ventricular size is normal. Cerebellum unremarkable. Vascular: No asymmetric hyperdense vasculature at the skull base. Moderate atherosclerotic calcification is noted within the carotid siphons bilaterally. Skull: Intact Sinuses/Orbits: Moderate mucosal thickening within the sphenoid sinuses. Mucous is noted within the visualized maxillary sinuses which is largely excluded from this examination. Remaining paranasal sinuses are clear. Orbits are unremarkable. Other: Mastoid air cells and middle ear cavities are clear. IMPRESSION: No evidence of acute intracranial hemorrhage or infarct. Remote right basal ganglia lacunar infarct. Mild paranasal sinus disease, incompletely assessed on this examination. Electronically Signed   By: Helyn Numbers MD   On: 12/21/2019 11:53   CT Angio Chest PE W and/or Wo Contrast  Result Date: 12/10/2019 CLINICAL DATA:  Fever, cough, altered mental status EXAM: CT ANGIOGRAPHY CHEST WITH CONTRAST TECHNIQUE: Multidetector CT imaging of the chest was performed using the standard protocol during bolus administration of intravenous contrast. Multiplanar CT image reconstructions and MIPs were obtained to evaluate the vascular anatomy. CONTRAST:  75mL OMNIPAQUE IOHEXOL 350 MG/ML SOLN COMPARISON:  None.  FINDINGS: Cardiovascular: Satisfactory opacification of the pulmonary arteries to the segmental level. No evidence of pulmonary embolism. The central pulmonary arteries are of normal caliber. No significant coronary artery calcification. Normal heart size. No pericardial effusion. The thoracic aorta is unremarkable. Mediastinum/Nodes: Thyroid unremarkable. Esophagus unremarkable. There is shotty mediastinal lymphadenopathy within the right paratracheal, subcarinal, and prevascular lymph node groups, possibly reactive in nature. No pathologic adenopathy within the thorax. Lungs/Pleura: There is extensive bilateral predominantly mid and lower lung zone ground-glass pulmonary infiltrate demonstrating areas of "crazy paving" within the mid lung zones, and dense consolidation within the posterior basal lower lobes bilaterally in keeping with acute atypical infection or inflammation. The findings are typical of that seen in acute to subacute COVID-19 pneumonia. There is extensive parenchymal involvement. No pneumothorax or pleural effusion. Central airways are widely patent. Upper Abdomen: No acute abnormality. Musculoskeletal: No chest wall abnormality. No acute or significant osseous findings. Review of the MIP images confirms the above findings. IMPRESSION: No pulmonary embolism. Extensive bilateral airspace infiltrate in keeping with acute infection or inflammation. The findings are typical of that seen with COVID-19 pneumonia. Extensive parenchymal involvement noted. Electronically Signed   By: Helyn Numbers MD   On: 12/25/2019 11:59   DG Chest Port 1 View  Result Date: 12/19/2019 CLINICAL DATA:  Hypoxia EXAM: PORTABLE CHEST 1 VIEW COMPARISON:  12/19/2019, 12/18/2019, 12/17/2019 FINDINGS: Endotracheal tube tip is about 3.4 cm superior to the carina. Esophageal tube tip is below the diaphragm. Right upper extremity central venous catheter tip over the SVC. Slight interval increase in bilateral ground-glass  opacity and consolidations. Stable cardiomediastinal silhouette. No pneumothorax. IMPRESSION: Slight  interval worsening of bilateral airspace disease compared to prior. Electronically Signed   By: Jasmine PangKim  Fujinaga M.D.   On: 12/19/2019 23:34   DG Chest Port 1 View  Result Date: 12/19/2019 CLINICAL DATA:  Altered mental status, sepsis, COVID pneumonia EXAM: PORTABLE CHEST 1 VIEW COMPARISON:  4:04 a.m. FINDINGS: Endotracheal tube is unchanged, 2.6 cm above the carina. Nasogastric tube extends into the left upper quadrant of the abdomen, beyond the margin of the examination. Right upper extremity PICC line is been placed with its tip overlying the superior vena cava. Pulmonary insufflation is normal and symmetric. Superimposed asymmetric airspace infiltrate has progressed diffusely, and appears more severe within the lung bases bilaterally. No pneumothorax or pleural effusion. Cardiac size is at the upper limits of normal. No acute bone abnormality. IMPRESSION: Right upper extremity PICC line tip within the superior vena cava. Progressive diffuse pulmonary infiltrate, likely infectious or inflammatory. Electronically Signed   By: Helyn NumbersAshesh  Parikh MD   On: 12/19/2019 11:22   DG Chest Port 1 View  Result Date: 12/18/2019 CLINICAL DATA:  Shortness of breath EXAM: PORTABLE CHEST 1 VIEW COMPARISON:  12/17/2019 FINDINGS: Endotracheal tube and enteric tube are unchanged in position. Cardiac enlargement. Bilateral perihilar infiltrates may represent edema or pneumonia. Unchanged appearance since prior study. No pleural effusions. No pneumothorax. Mediastinal contours appear intact. IMPRESSION: Cardiac enlargement with bilateral perihilar infiltrates, unchanged. Electronically Signed   By: Burman NievesWilliam  Stevens M.D.   On: 12/18/2019 04:27   DG Chest Port 1 View  Result Date: 12/17/2019 CLINICAL DATA:  Shortness of breath EXAM: PORTABLE CHEST 1 VIEW COMPARISON:  12/16/2019 FINDINGS: The endotracheal tube has been withdrawn,  now measuring 2 cm above the carina. Enteric tube is unchanged in position. Tip is off the field of view but below the left hemidiaphragm. Borderline heart size. Bilateral perihilar and basilar airspace and interstitial infiltrates. No significant change. IMPRESSION: Endotracheal tube tip now measures 2 cm above the carina. Persistent bilateral perihilar and basilar infiltrates. Electronically Signed   By: Burman NievesWilliam  Stevens M.D.   On: 12/17/2019 05:41   DG Chest Port 1 View  Result Date: 12/16/2019 CLINICAL DATA:  Post intubation, OG tube placement EXAM: PORTABLE CHEST 1 VIEW COMPARISON:  None. FINDINGS: Endotracheal tube is in the right mainstem bronchus. Recommend retracting 3 cm. OG tube is in the stomach. Patchy bilateral airspace disease. Borderline heart size. No effusions or pneumothorax. IMPRESSION: Endotracheal tube in the right mainstem bronchus. Recommend retracting 3 cm. OG tube in the stomach. Continued patchy bilateral airspace disease. Critical Value/emergent results were called by telephone at the time of interpretation on 12/16/2019 at 8:37 pm to the charge nurse, who verbally acknowledged these results. Electronically Signed   By: Charlett NoseKevin  Dover M.D.   On: 12/16/2019 20:39   US EKG SITE RITE  Result Date: 12/18/2019 If Site Rite image not attached, placement could not be confirmed due to current cardiac rhythm.   Microbiology Recent Results (from the past 240 hour(s))  Blood Culture (routine x 2)     Status: None (Preliminary result)   Collection Time: Jun 04, 2019  8:03 AM   Specimen: BLOOD  Result Value Ref Range Status   Specimen Description BLOOD LEFT ANTECUBITAL  Final   Special Requests   Final    BOTTLES DRAWN AEROBIC AND ANAEROBIC Blood Culture adequate volume   Culture   Final    NO GROWTH 4 DAYS Performed at [facility], [address] [place], [place] [id number]    Report Status PENDING  Incomplete  Urine culture     Status: None   Collection Time:  December 30, 2019  8:03 AM   Specimen: In/Out Cath Urine  Result Value Ref Range Status   Specimen Description   Final    IN/OUT CATH URINE Performed at [facility], [address], [place], [place] [address]    Special Requests   Final    NONE Performed at [facility], [address] [place], [place] [address]    Culture   Final    NO GROWTH Performed at [facility], [address], [place], [place] [address]    Report Status 12/17/2019 FINAL  Final  Blood Culture (routine x 2)     Status: None (Preliminary result)   Collection Time: 2019-12-30  8:07 AM   Specimen: BLOOD  Result Value Ref Range Status   Specimen Description BLOOD BLOOD LEFT FOREARM  Final   Special Requests   Final    BOTTLES DRAWN AEROBIC AND ANAEROBIC Blood Culture adequate volume   Culture   Final    NO GROWTH 4 DAYS Performed at [facility], [address], [place], [place] [address]    Report Status PENDING  Incomplete  Resp Panel by RT PCR (RSV, Flu A&B, Covid) -     Status: None   Collection Time: 12/30/2019  8:52 AM  Result Value Ref Range Status   SARS Coronavirus 2 by RT PCR NEGATIVE NEGATIVE Final    Comment: (NOTE) SARS-CoV-2 target nucleic acids are NOT DETECTED.  The SARS-CoV-2 RNA is generally detectable in upper respiratoy specimens during the acute phase of infection. The lowest concentration of SARS-CoV-2 viral copies this assay can detect is 131 copies/mL. A negative result does not preclude SARS-Cov-2 infection and should not be used as the sole basis for treatment or other patient management decisions. A negative result may occur with  improper specimen collection/handling, submission of specimen other than nasopharyngeal swab, presence of viral mutation(s) within the areas targeted by this assay, and inadequate number of viral copies (<131 copies/mL). A negative result must be combined with clinical observations, patient history, and epidemiological  information. The expected result is Negative.  Fact Sheet for Patients:  https://www.moore.com/  Fact Sheet for Healthcare Providers:  https://www.young.biz/  This test is no t yet approved or cleared by the [place] FDA and  has been authorized for detection and/or diagnosis of SARS-CoV-2 by FDA under an Emergency Use Authorization (EUA). This EUA will remain  in effect (meaning this test can be used) for the duration of the COVID-19 declaration under Section 564(b)(1) of the Act, 21 U.S.C. section 360bbb-3(b)(1), unless the authorization is terminated or revoked sooner.     Influenza A by PCR NEGATIVE NEGATIVE Final   Influenza B by PCR NEGATIVE NEGATIVE Final    Comment: (NOTE) The Xpert Xpress SARS-CoV-2/FLU/RSV assay is intended as an aid in  the diagnosis of influenza from Nasopharyngeal swab specimens and  should not be used as a sole basis for treatment. Nasal washings and  aspirates are unacceptable for Xpert Xpress SARS-CoV-2/FLU/RSV  testing.  Fact Sheet for Patients: https://www.moore.com/  Fact Sheet for Healthcare Providers: https://www.young.biz/  This test is not yet approved or cleared by the [place] FDA and  has been authorized for detection and/or diagnosis of SARS-CoV-2 by  FDA under an Emergency Use Authorization (EUA). This EUA will remain  in effect (meaning this test can be used) for the duration of the  Covid-19 declaration under Section  564(b)(1) of the Act, 21  U.S.C. section 360bbb-3(b)(1), unless the authorization is  terminated or revoked.    Respiratory Syncytial Virus by PCR NEGATIVE NEGATIVE Final    Comment: (NOTE) Fact Sheet for Patients: https://www.moore.com/  Fact Sheet for Healthcare Providers: https://www.young.biz/  This test is not yet approved or cleared by the [place] FDA and  has been  authorized for detection and/or diagnosis of SARS-CoV-2 by  FDA under an Emergency Use Authorization (EUA). This EUA will remain  in effect (meaning this test can be used) for the duration of the  COVID-19 declaration under Section 564(b)(1) of the Act, 21 U.S.C.  section 360bbb-3(b)(1), unless the authorization is terminated or  revoked. Performed at [facility], [address] [place], [place] [address]   Respiratory Panel by RT PCR (Flu A&B, Covid) -     Status: Abnormal   Collection Time: 12/04/2019 12:55 PM  Result Value Ref Range Status   SARS Coronavirus 2 by RT PCR POSITIVE (A) NEGATIVE Final    Comment: RESULT CALLED TO, READ BACK BY AND VERIFIED WITH: DAVID, RACUEL AT 2009 ON 12/13/2019 BY SS (NOTE) SARS-CoV-2 target nucleic acids are DETECTED.  SARS-CoV-2 RNA is generally detectable in upper respiratory specimens  during the acute phase of infection. Positive results are indicative of the presence of the identified virus, but do not rule out bacterial infection or co-infection with other pathogens not detected by the test. Clinical correlation with patient history and other diagnostic information is necessary to determine patient infection status. The expected result is Negative.  Fact Sheet for Patients:  https://www.moore.com/  Fact Sheet for Healthcare Providers: https://www.young.biz/  This test is not yet approved or cleared by the [place] FDA and  has been authorized for detection and/or diagnosis of SARS-CoV-2 by FDA under an Emergency Use Authorization (EUA).  This EUA will remain in effect (meaning this test ca n be used) for the duration of  the COVID-19 declaration under Section 564(b)(1) of the Act, 21 U.S.C. section 360bbb-3(b)(1), unless the authorization is terminated or revoked sooner.      Influenza A by PCR NEGATIVE NEGATIVE Final   Influenza B by PCR NEGATIVE NEGATIVE Final    Comment:  (NOTE) The Xpert Xpress SARS-CoV-2/FLU/RSV assay is intended as an aid in  the diagnosis of influenza from Nasopharyngeal swab specimens and  should not be used as a sole basis for treatment. Nasal washings and  aspirates are unacceptable for Xpert Xpress SARS-CoV-2/FLU/RSV  testing.  Fact Sheet for Patients: https://www.moore.com/  Fact Sheet for Healthcare Providers: https://www.young.biz/  This test is not yet approved or cleared by the [place] FDA and  has been authorized for detection and/or diagnosis of SARS-CoV-2 by  FDA under an Emergency Use Authorization (EUA). This EUA will remain  in effect (meaning this test can be used) for the duration of the  Covid-19 declaration under Section 564(b)(1) of the Act, 21  U.S.C. section 360bbb-3(b)(1), unless the authorization is  terminated or revoked. Performed at [facility], [address] [place], [place] [address]   MRSA PCR Screening     Status: None   Collection Time: 12/16/19  8:41 PM   Specimen: Nasopharyngeal  Result Value Ref Range Status   MRSA by PCR NEGATIVE NEGATIVE Final    Comment:        The GeneXpert MRSA Assay (FDA approved for NASAL specimens only), is one component of a comprehensive MRSA colonization surveillance program. It is not intended to diagnose  MRSA infection nor to guide or monitor treatment for MRSA infections. Performed at [facility], [address] [place], [place] [address]   Respiratory Panel by RT PCR (Flu A&B, Covid) - Nasopharyngeal Swab     Status: Abnormal   Collection Time: 12/17/19 10:37 AM   Specimen: Nasopharyngeal Swab  Result Value Ref Range Status   SARS Coronavirus 2 by RT PCR POSITIVE (A) NEGATIVE Final    Comment: RESULT CALLED TO, READ BACK BY AND VERIFIED WITH: BROOKE DARNELL 12/17/19 1135 KLW (NOTE) SARS-CoV-2 target nucleic acids are DETECTED.  SARS-CoV-2 RNA is generally detectable in upper  respiratory specimens  during the acute phase of infection. Positive results are indicative of the presence of the identified virus, but do not rule out bacterial infection or co-infection with other pathogens not detected by the test. Clinical correlation with patient history and other diagnostic information is necessary to determine patient infection status. The expected result is Negative.  Fact Sheet for Patients:  https://www.moore.com/  Fact Sheet for Healthcare Providers: https://www.young.biz/  This test is not yet approved or cleared by the [place] FDA and  has been authorized for detection and/or diagnosis of SARS-CoV-2 by FDA under an Emergency Use Authorization (EUA).  This EUA will remain in effect (meaning this test can be [place] ed) for the duration of  the COVID-19 declaration under Section 564(b)(1) of the Act, 21 U.S.C. section 360bbb-3(b)(1), unless the authorization is terminated or revoked sooner.      Influenza A by PCR NEGATIVE NEGATIVE Final   Influenza B by PCR NEGATIVE NEGATIVE Final    Comment: (NOTE) The Xpert Xpress SARS-CoV-2/FLU/RSV assay is intended as an aid in  the diagnosis of influenza from Nasopharyngeal swab specimens and  should not be used as a sole basis for treatment. Nasal washings and  aspirates are unacceptable for Xpert Xpress SARS-CoV-2/FLU/RSV  testing.  Fact Sheet for Patients: https://www.moore.com/  Fact Sheet for Healthcare Providers: https://www.young.biz/  This test is not yet approved or cleared by the [place] FDA and  has been authorized for detection and/or diagnosis of SARS-CoV-2 by  FDA under an Emergency Use Authorization (EUA). This EUA will remain  in effect (meaning this test can be used) for the duration of the  Covid-19 declaration under Section 564(b)(1) of the Act, 21  U.S.C. section 360bbb-3(b)(1), unless the authorization  is  terminated or revoked. Performed at [facility], [address] [place], [place] [address]     Lab Basic Metabolic Panel: Recent Labs  Lab 12/24/19 0803 12/16/19 0443 12/17/19 0504 12/18/19 0449 12/19/19 0432  NA 131* 135 136 138 142  K 3.4* 4.0 4.0 4.5 3.4*  CL 94* 104 105 107 113*  CO2 23 18* 20* 21* 21*  GLUCOSE 230* 229* 309* 314* 299*  BUN 15 16 34* 59* 60*  CREATININE 0.85 0.60 0.86 1.02* 0.85  CALCIUM 8.6* 8.1* 8.8* 8.6* 7.0*  MG 2.1  --  2.6* 2.8* 2.2  PHOS  --   --  3.3 3.6 2.4*   Liver Function Tests: Recent Labs  Lab 2019-12-24 0803 12/17/19 0504 12/18/19 0449 12/19/19 0432  AST 46* 26 24 33  ALT 41 30 27 27   ALKPHOS 90 109 111 75  BILITOT 1.6* 1.1 1.1 0.8  PROT 7.8 6.5 6.4* 5.2*  ALBUMIN 3.1* 2.4* 2.3* 2.0*   No results for input(s): LIPASE, AMYLASE in the last 168 hours. No results for input(s): AMMONIA in the last 168 hours. CBC: Recent Labs  Lab 11/28/2019 0803 12/16/19 0443 12/17/19 0504 12/18/19 0449 12/19/19 0432  WBC 12.8* 14.1* 23.2* 24.5* 12.8*  NEUTROABS 10.4*  --   --   --   --   HGB 13.4 12.3 12.2 12.1 10.2*  HCT 40.1 36.7 37.1 37.3 32.4*  MCV 86.4 87.6 88.1 89.7 91.8  PLT 417* 361 266 268 150   Cardiac Enzymes: No results for input(s): CKTOTAL, CKMB, CKMBINDEX, TROPONINI in the last 168 hours. Sepsis Labs: Recent Labs  Lab 12/08/2019 0803 12/03/2019 0803 12/06/2019 0807 12/28/2019 1039 12/16/19 0443 12/17/19 0504 12/18/19 0449 12/19/19 0432  PROCALCITON 0.14  --   --   --  1.59 2.56  --   --   WBC 12.8*   < >  --   --  14.1* 23.2* 24.5* 12.8*  LATICACIDVEN  --   --  3.4* 1.7  --   --   --   --    < > = values in this interval not displayed.    Procedures/Operations  12/16/19: Endotracheal Intubation 12/18/19: Right Basilic PICC Line insertion January 04, 2020: Left Femoral A-line insertion      [name], [facility] [place] [facility] Pager: [phone]  Judithe Modest January 04, 2020, 4:24 AM

## 2019-12-29 NOTE — Progress Notes (Signed)
Coded multiple times, as soon as epinephrine would wear off, her pulse would drop.

## 2019-12-29 NOTE — Progress Notes (Signed)
eLink Physician-Brief Progress Note Patient Name: Alexis Daugherty DOB: November 27, 1955 MRN: [id number]   Date of Service  December 23, 2019  HPI/Events of Note  Patient with Covid 19 pneumonia and severe acute hypoxemic respiratory failure refractory to proning and paralysis, saturations currently in the 50-60 % range.  eICU Interventions  I called Dr. Rexanne Mano of the ECMO team to discuss possibly offering the patient ECMO, per Dr. Laneta Simmers patient does not meet criteria for ECMO. Family has been asked to come in on a compassionate basis by [facility] ANP [name].        Thomasene Lot Larissa Pegg 12/23/19, 12:26 AM

## 2019-12-29 NOTE — Significant Event (Signed)
CODE BLUE DOCUMENTATION  03:58 Pt again with bradycardia progressing to PEA and asystole.  CPR and ACLS begun.  Pt received 2 rounds of Epi and 1 amp Bicarb.  Pt already has epinephrine gtt infusing.  This is the pt's 3rd cardiac arrest within the past 30 minutes.  Pt's cardiac arrest attributed to severe hypoxia (despite max vent support) and severe hypotension (despite 3 vasopressors and stress dose steroids). As soon as ACLS medications wear off, pt loses pulses.  Unable to obtain ROSC.  Time of death called at 04:07.      [name], AGACNP-BC [facility] Pager: [phone]

## 2019-12-29 NOTE — Progress Notes (Signed)
Recruitment maneuver performed on patient due to sats in 60's PC 40 R10 peep5  Ti 3 secs 100% for 2 mins per NP, Elvina Sidle.  Settings returned to vt 420 r24 100% peep 15 per verbal order from NP, Elvina Sidle.  Saturation remains in 60's with interventions.

## 2019-12-29 NOTE — Progress Notes (Signed)
Assisted/observed Arterial line placement. Time out preformed ensuring right patient, right procedure, and right site. Alexis Ditty NP placed an arterial line in L femoral artery no complications from procedure.

## 2019-12-29 NOTE — Progress Notes (Signed)
Chaplain engaged in initial visit with Alexis Daugherty's two daughters, son in law, and grandchildren.  Chaplain offered the ministries of support and prayer with the family.  They shared that Alexis Daugherty is a faithful believer and that they believe in God.  They wanted to pray that a miracle happens for Alexis Daugherty.  Chaplain was able to pray with family outside of Alexis Daugherty's room, and family was able to pray inside and outside of Alexis Daugherty's room in their language. Alexis Daugherty's daughter was able to provide a Bible to put in Alexis Daugherty's room in a plastic bag to place beside her.  They shared that Sarahann loves the Psalms and that she loves to sing. Family was also able to share that Alexis Daugherty is strong, humble, faith-driven, and a huge source of strength for her family.  They talked about the ways in which she has loved on them and brought them together, even mentioning the way she cooks. Family mentioned that they were planning to go to [place] to see Alexis Daugherty's other children and a grandchild that is on the way but that COVID made that impossible. Chaplain affirmed the honor, reverence, and love they all hold for Alexis Daugherty.    Family was able to be updated by the physician concerning Alexis Daugherty's current status.  They took a moment in the waiting area to breathe and talk before leaving.  Family was grateful for the work of medical staff and appreciative of all that is being done on behalf of Alexis Daugherty.    Chaplain assesses and discussed with family the possibilities of a video visit so that Alexis Daugherty's family in [place] can see her.  Chaplain will follow-up and partner with family as needed and continue to offer support.      December 26, 2019 0100  Clinical Encounter Type  Visited With Family  Visit Type Initial;Spiritual support  Referral From Family;Nurse  Consult/Referral To Chaplain  Spiritual Encounters  Spiritual Needs Grief support;Emotional;Prayer  Stress Factors  Family Stress Factors Health changes;Major life changes

## 2019-12-29 DEATH — deceased

## 2022-10-08 IMAGING — DX DG CHEST 1V PORT
1 series · 1 of 1 positions shown · non-contrast
Comparison: 12/19/2019, 12/18/2019, 12/17/2019

CLINICAL DATA: Hypoxia

EXAM:
PORTABLE CHEST 1 VIEW

[chest ap]
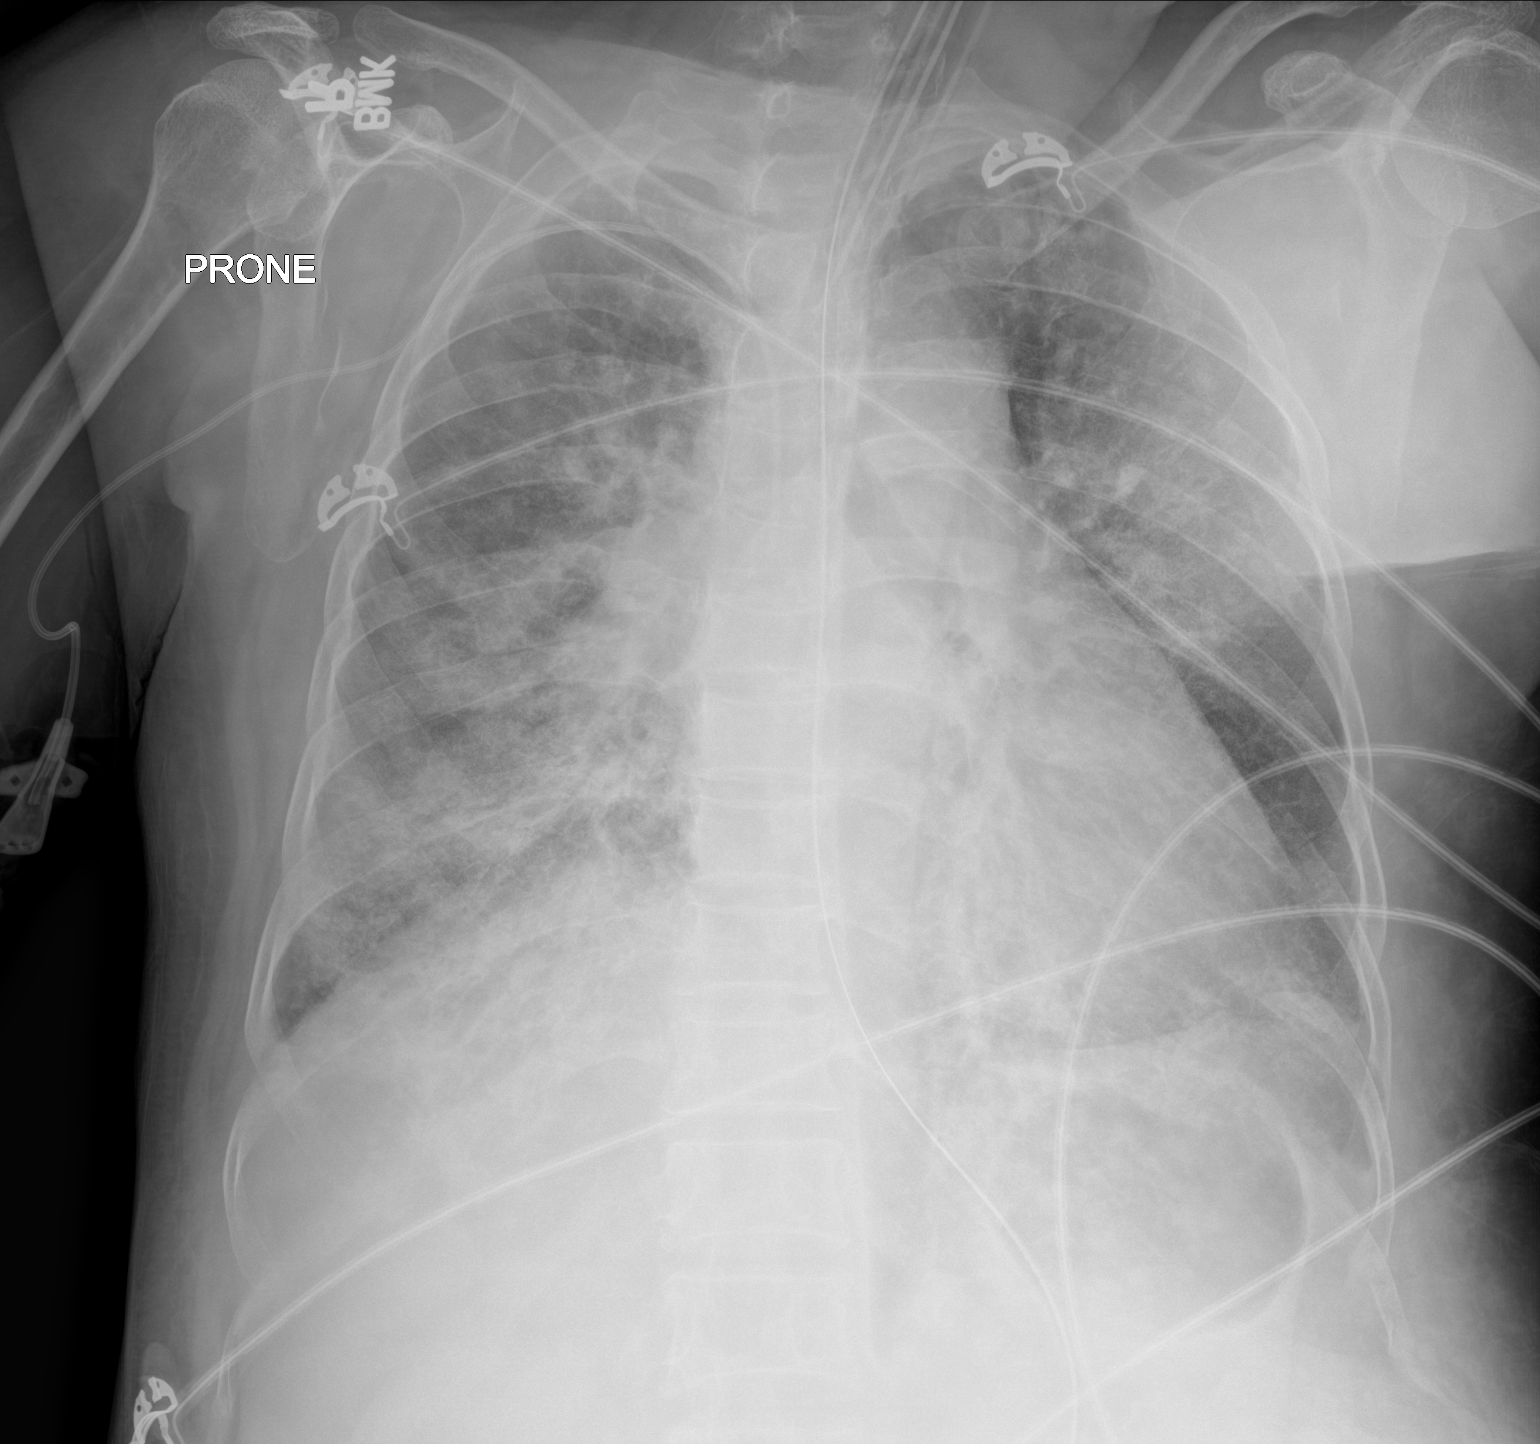

[1 of 1 positions shown; findings below may reference images not displayed]

FINDINGS: Endotracheal tube tip is about 3.4 cm superior to the carina.
Esophageal tube tip is below the diaphragm. Right upper extremity
central venous catheter tip over the SVC. Slight interval increase
in bilateral ground-glass opacity and consolidations. Stable
cardiomediastinal silhouette. No pneumothorax.
IMPRESSION: Slight interval worsening of bilateral airspace disease compared to
prior.
